# Patient Record
Sex: Male | Born: 1955 | Race: White | Hispanic: No | Marital: Married | Smoking: Never smoker
Health system: Southern US, Academic
[De-identification: ages and names within clinical notes are randomized; demographics above are authoritative.]

## PROBLEM LIST (undated history)

## (undated) DIAGNOSIS — E039 Hypothyroidism, unspecified: Secondary | ICD-10-CM

## (undated) DIAGNOSIS — E781 Pure hyperglyceridemia: Secondary | ICD-10-CM

## (undated) DIAGNOSIS — I10 Essential (primary) hypertension: Secondary | ICD-10-CM

## (undated) DIAGNOSIS — M25569 Pain in unspecified knee: Secondary | ICD-10-CM

## (undated) HISTORY — DX: Essential (primary) hypertension: I10

## (undated) HISTORY — DX: Pure hyperglyceridemia: E78.1

## (undated) HISTORY — PX: HX APPENDECTOMY: SHX54

## (undated) HISTORY — DX: Hypothyroidism, unspecified: E03.9

## (undated) HISTORY — DX: Pain in unspecified knee: M25.569

## (undated) NOTE — Progress Notes (Signed)
Formatting of this note is different from the original.  Images from the original note were not included.      Patient Name: Steven [name], Steven [name]  [id number] Date: 05/30/2023    Preferred Name: Steven [name] Date of Encounter: 05/30/2023   Patient MRN / CSN: [id number] / [id number] Appt. Time:  2:30 PM EST   Patient Age / DOB: 62 year old / [birth year] Encounter Provider: Lynann Beaver, MD [facility]   Referring Physician: No ref. provider found               Pulmonary note Follow Up    REASON FOR the visit:   pulmonary function tests follow-up    SAY:TKZS is a 63 year old who is being seen today for  further evaluation of shortness of breath.  His pulmonary function tests were normal.  He is still being worked up by his local cardiologist reports are not available but so far everything has been negative as far as the patient is concern.  We will be willing to do methacholine challenge test since the patient is environment.    Active Medical Problems:  1.hypothyroidism  2. Metabolic syndrome  3.  hyperlipidemia  4. Hypertension    Last CT Scan:  None  Smoking history: never smoker  Occupational history:  Theatre stage manager  .  Allergies:  Steven [name] has No Known Allergies.    Medications:  Current Outpatient Medications   Medication Sig Dispense Refill    atorvastatin (Lipitor) 10 MG tablet Take by mouth Daily.      levothyroxine (Synthroid) 50 MCG tablet TAKE 1 TABLET BY MOUTH EVERY DAY FOR 90 DAYS      losartan (Cozaar) 50 MG tablet       metFORMIN (Glucophage) 500 MG tablet TAKE 1 TABLET BY MOUTH EVERY 3 DAYS      vitamin B-12 (Cyanocobalamin) 1000 MCG tablet Take 1,000 mcg by mouth Daily.      vitamin D, ergocalciferol, 1.25 MG (50000 UT) capsule        No current facility-administered medications for this visit.     Medical History:    Active Ambulatory Problems     Diagnosis Date Noted    Dyspnea 05/30/2023     Resolved Ambulatory Problems     Diagnosis Date Noted    No Resolved Ambulatory Problems     Past Medical History:    Diagnosis Date    Chronic kidney disease     Hyperlipidemia     Hypertensive disorder     Hypothyroidism      Family History   Problem Relation Name Age of Onset    Hypertension Mother      Diabetes Mother      Diabetes Father      Cancer Brother          Colon     Social History     Tobacco Use   Smoking Status Never   Smokeless Tobacco Current    Types: Chew     Social History     Substance and Sexual Activity   Drug Use Never     Immunization History   Administered Date(s) Administered    COVID 19 MRNA VACC (Moderna) 08/08/2019, 09/05/2019, 06/03/2020    COVID 19 MRNA VACC 50MCG/0.5ML (MODERNA) 05/05/2023    COVID 19 VACC BIVALENT MODERNA BOOSTER 06/23/2021     Review of Systems:   The 12 point review of systems are negative except for those mentioned in the H and P    Objective:  Room Air:     Vitals:    05/30/23 1414   BP: (!) 159/84   Pulse: 79   Resp: 20   Temp: 97.8 F (36.6 C)   SpO2: 94%     No LMP for male patient.      Physical Exam:    Alert ox3 in non significant distress  HEENT: Head is normocephalic. Pupils are equally responsive to light and accomodation.  Mouth is normal  Neck size is Normal inches  CV: Normal heart sounds  Lungs:  breath sounds are distant to auscultation and percussion.  No crackles no rhonchi no wheezes.  Abdomen: Soft non tender,no visceromegaly  Extremities: No edema  Neurological exam: Alert ox 3. No focal findings  Psychiatrix Exam; Patien in no distress    Assessment and Plan:     75 year old gentleman with evidence for shortness of breath who is currently being evaluated by his cardiologist in [facility].  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  Dyspnea on exertion   2.  Hypertension   3.  BMI 37     Recommendations   1.  Await Cardiology clearance   2.  Methacholine challenge test   3.  Return to clinic in months   4.  Weight  loss via low carb diet    Problem List Items Addressed This Visit         Dyspnea - Primary     Relevant Orders     Bronchial Challenge with Methacholine     Other Visit Diagnosis       BMI 37.0-37.9, adult               No follow-ups on file.      A medical dictation software was used for portions of this report. Unintended voice transcription errors may have occurred.The purpose of this note is to communicate optimally with the other physicians, advanced care practitioners and appropriate medical staff involved in your care. It is written using standard medical terminology.        Elna Breslow. Felton Clinton MD,FCCP,FACP,FAASM  Pulmonary Critical Care Sleep Medicine Consultant  [phone]                                                                                      Electronically signed by Lynann Beaver, MD at 05/30/2023  2:57 PM EST

---

## 1990-12-30 ENCOUNTER — Emergency Department (HOSPITAL_COMMUNITY): Payer: Self-pay

## 2021-04-21 IMAGING — MR MRI KNEE LT W/O CONTRAST
4 of 5 series · 20 of 40 positions shown · IV contrast (gadolinium)
Comparison: None available.

﻿EXAM:  14118   MRI KNEE LT W/O CONTRAST
INDICATION: Pain for 1 month.
TECHNIQUE: Multiplanar multisequential MRI of the left knee joint was performed without gadolinium contrast.

[Series 5: PD fat-sat · axial · left · 4.0mm · 0.37mm/px · z∈[-95,+36]mm · 8 of 30 slices shown (1 of 3)]
[im 1/30]
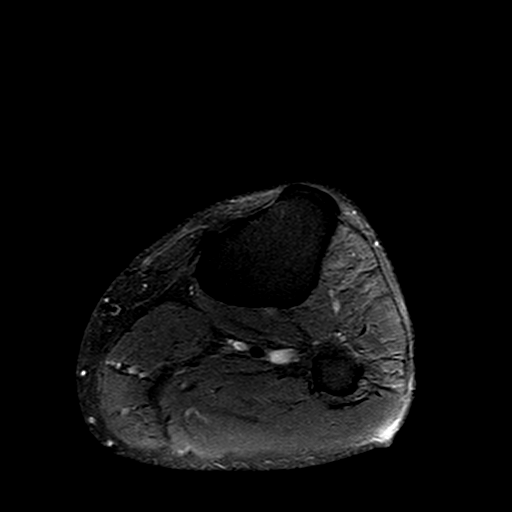
[im 5/30]
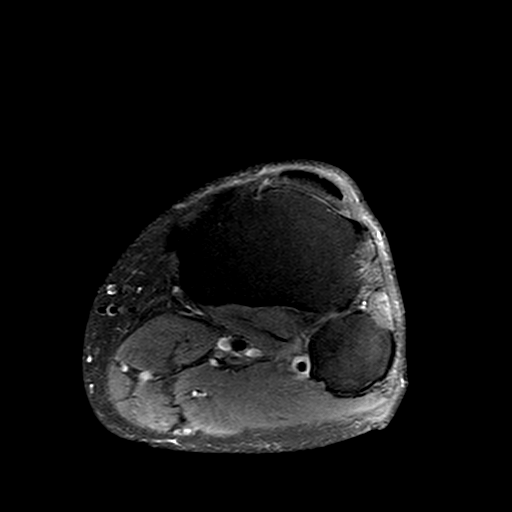
[im 9/30]
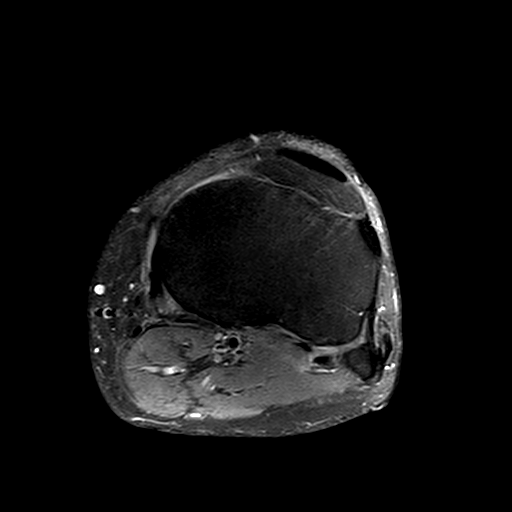
[im 13/30]
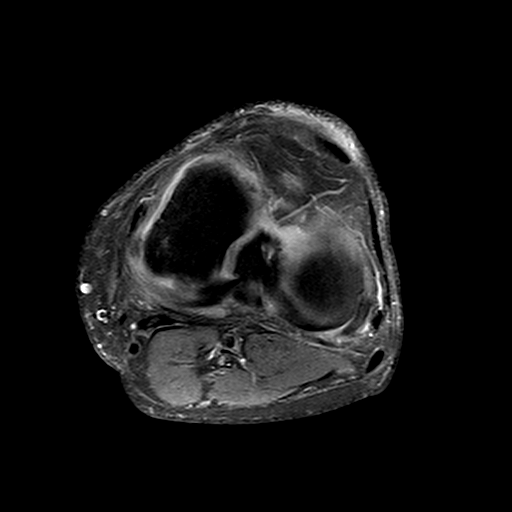
[im 17/30]
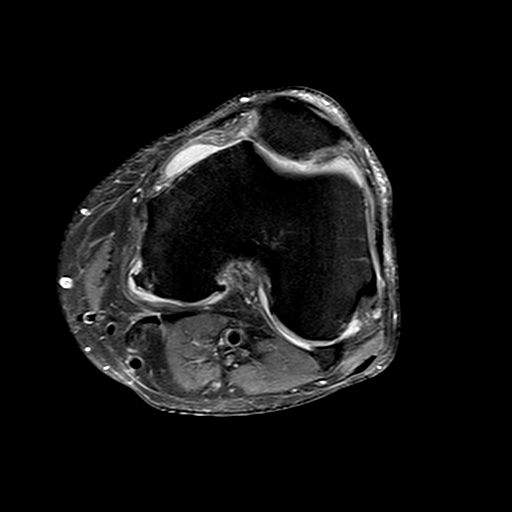
[im 21/30]
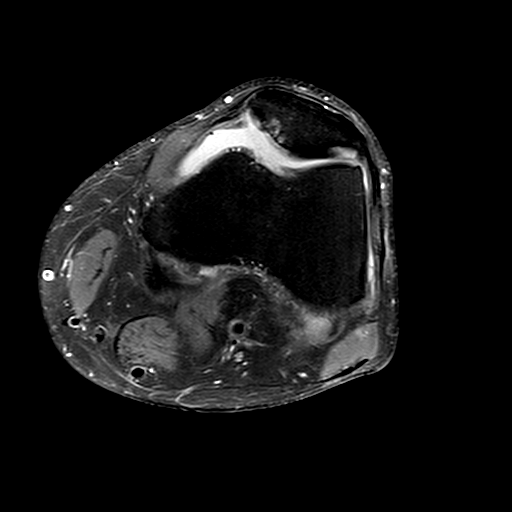
[im 25/30]
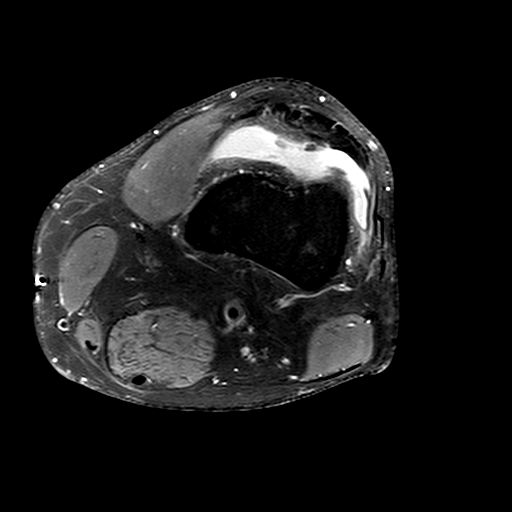
[im 30/30]
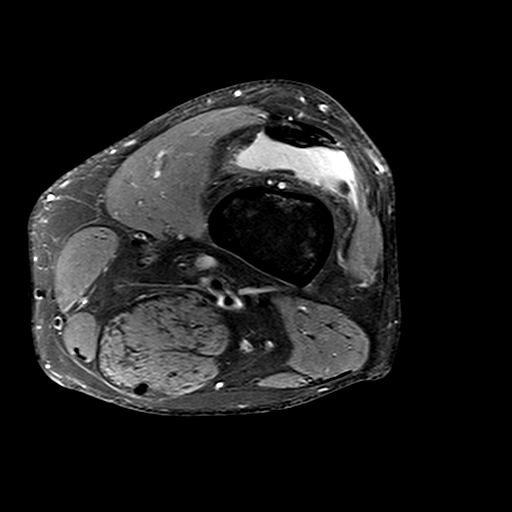

[Series 6: PD fat-sat · sagittal · left · 3.0mm · 0.29mm/px · 6 of 30 slices shown (2 of 3)]
[im 1/30]
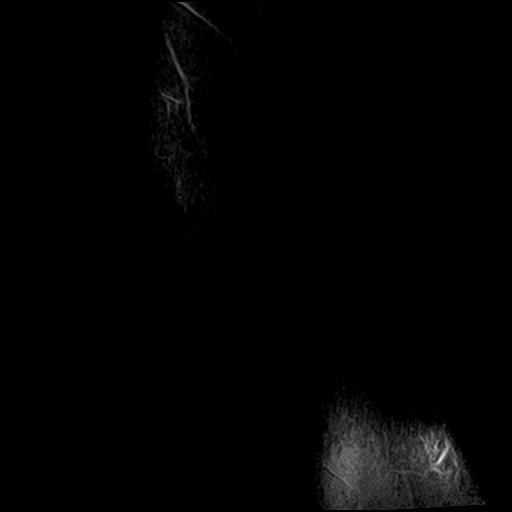
[im 5/30]
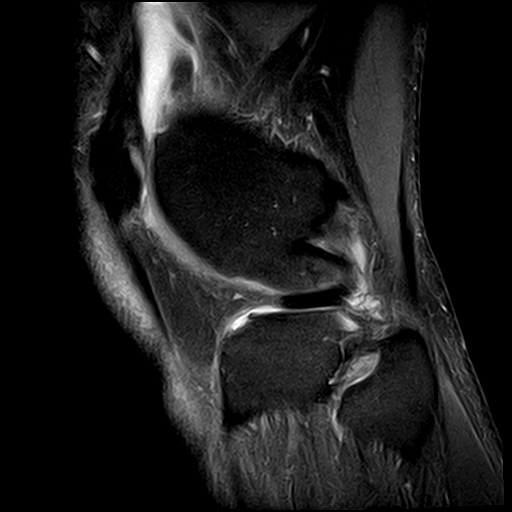
[im 9/30]
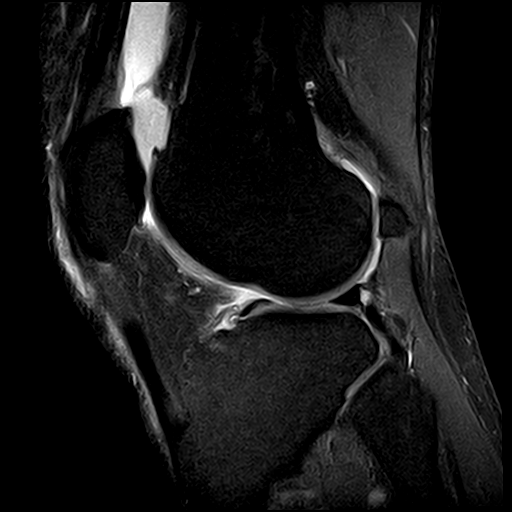
[im 13/30]
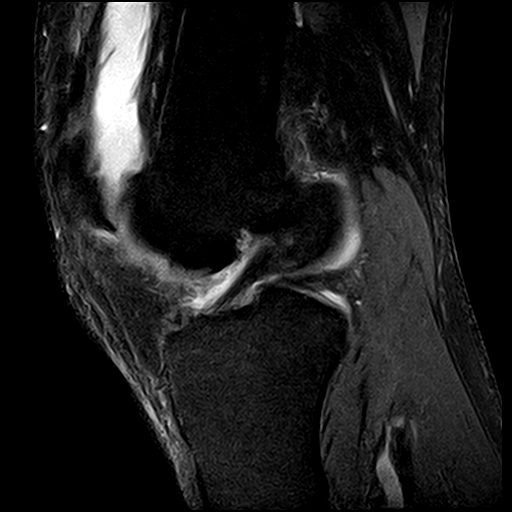
[im 17/30]
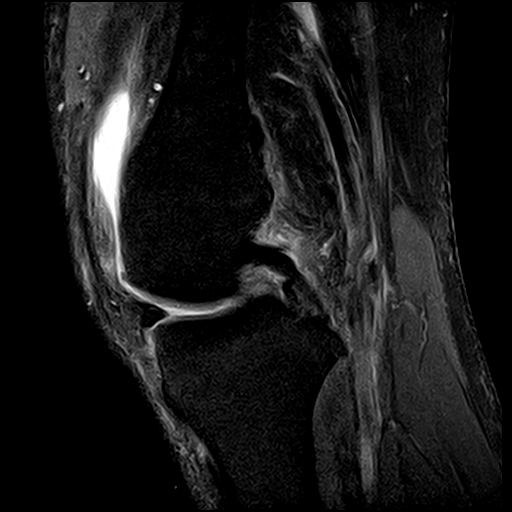
[im 25/30]
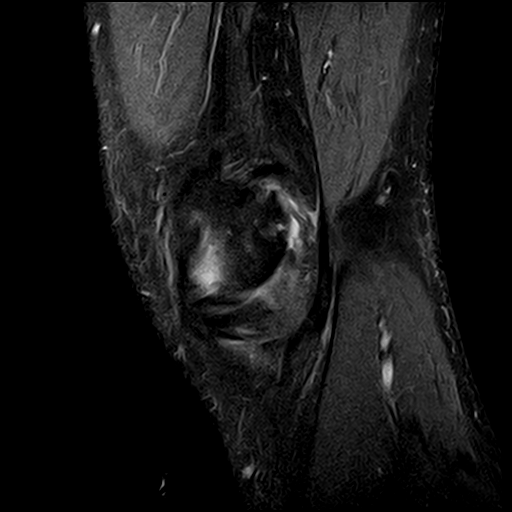

[Series 7: T1 · sagittal · left · 3.0mm · 0.29mm/px · 3 of 30 slices shown]
[im 5/30]
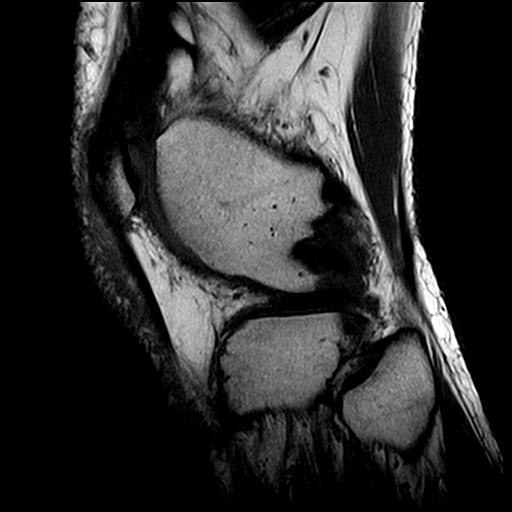
[im 17/30]
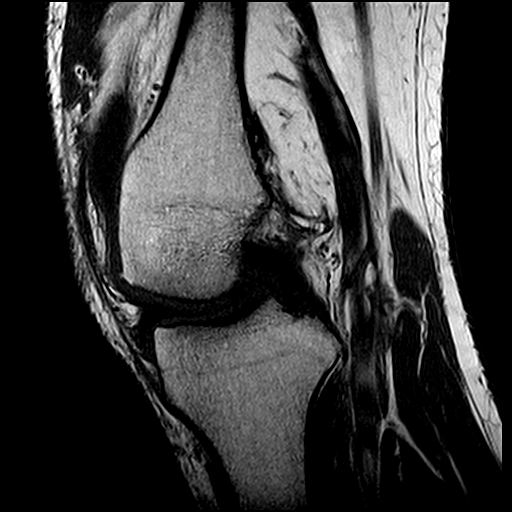
[im 25/30]
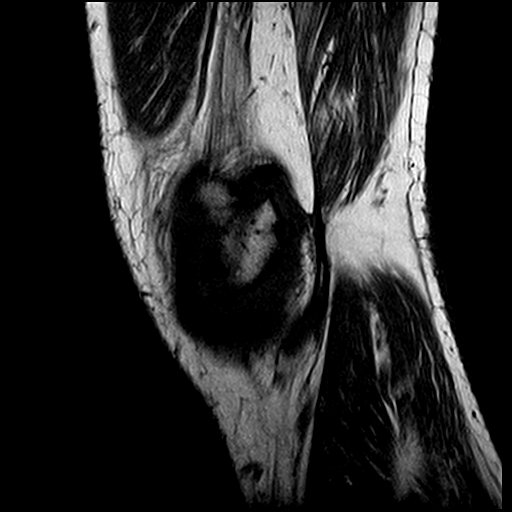

[Series 9: PD fat-sat · coronal · left · 3.0mm · 0.33mm/px · 3 of 27 slices shown (3 of 3)]
[im 4/27]
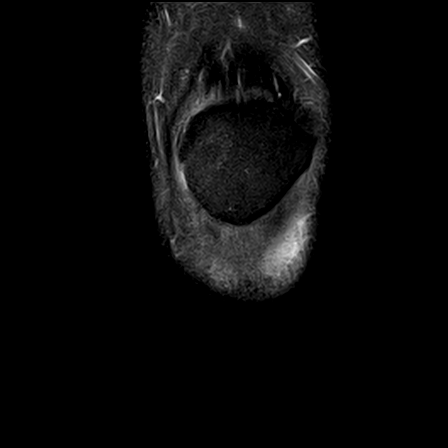
[im 15/27]
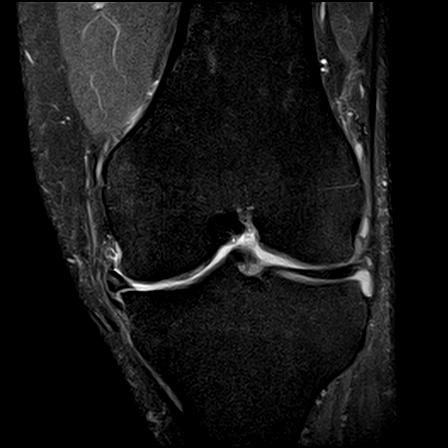
[im 23/27]
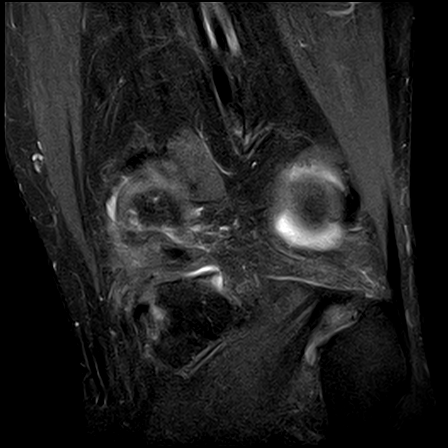

[20 of 40 positions shown; findings below may reference images not displayed]

FINDINGS: There is a complex medial meniscus tear. Lateral meniscus, cruciate and collateral ligaments are intact, within normal limits in morphology and signal intensity. There is grade 4 chondromalacia of the medial tibiofemoral and patellofemoral articulations. Extensor mechanism is intact. Capsular attachments appear unremarkable. Bone marrow signal intensity is normal. There is a moderate suprapatellar effusion. There is no Baker's cyst.
IMPRESSION: 1. Complex medial meniscus tear. 

2. Grade 4 chondromalacia of the medial tibiofemoral and patellofemoral articulations.

## 2022-02-28 IMAGING — MR MRI BRAIN W/O CONTRAST
6 of 8 series · 32 of 48 positions shown · IV contrast (gadavist)
Comparison: No prior imaging studies available.

﻿EXAM:  MRI BRAIN W/O CONTRAST
INDICATION: 65-year-old male with mild cognitive impairment.  Hypertension. Diabetic.
TECHNIQUE: Multiplanar, multisequential MRI of the brain was performed without Gadavist.

[Series 6: DWI · axial · 5.0mm · 1.35mm/px · z∈[-64,+80]mm · 6 of 25 slices shown (1 of 2)]
[im 1/25]
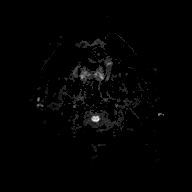
[im 5/25]
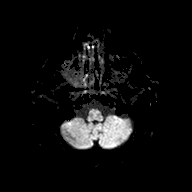
[im 10/25]
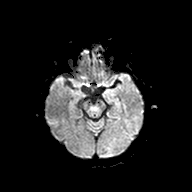
[im 15/25]
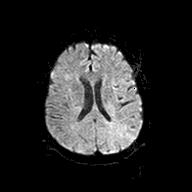
[im 20/25]
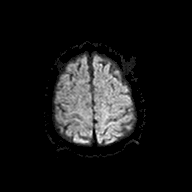
[im 25/25]
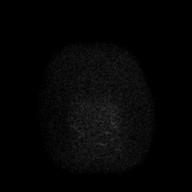

[Series 7: DWI · axial · 5.0mm · 1.35mm/px · z∈[-64,+80]mm · 6 of 23 slices shown (2 of 2)]
[im 1/23]
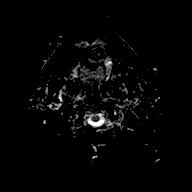
[im 5/23]
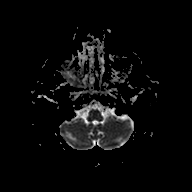
[im 9/23]
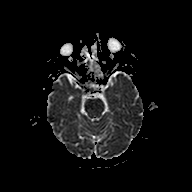
[im 14/23]
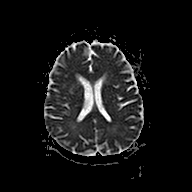
[im 18/23]
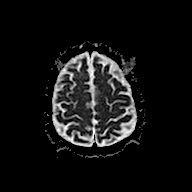
[im 23/23]
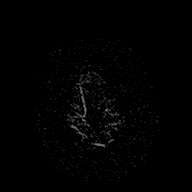

[Series 8: FLAIR · sagittal · 4.0mm · 0.75mm/px · 6 of 26 slices shown (1 of 2)]
[im 1/26]
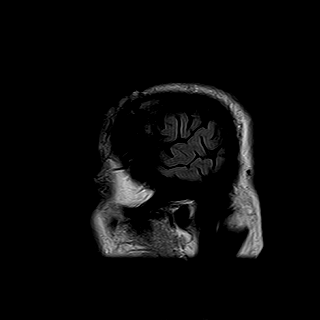
[im 6/26]
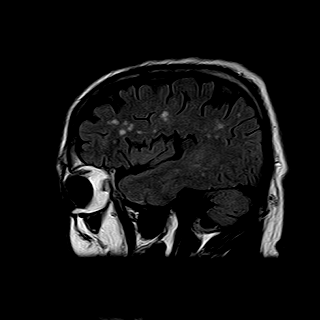
[im 11/26]
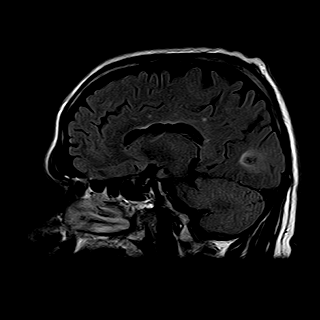
[im 16/26]
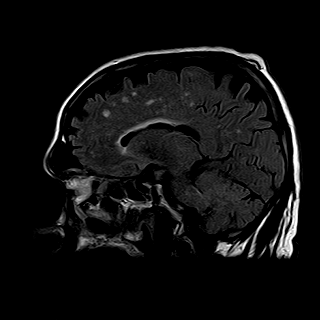
[im 21/26]
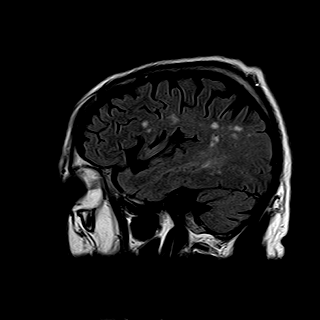
[im 26/26]
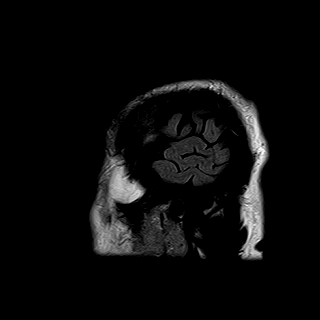

[Series 9: T2 · axial · 5.0mm · 0.43mm/px · z∈[-59,+85]mm · 6 of 25 slices shown (1 of 2)]
[im 1/25]
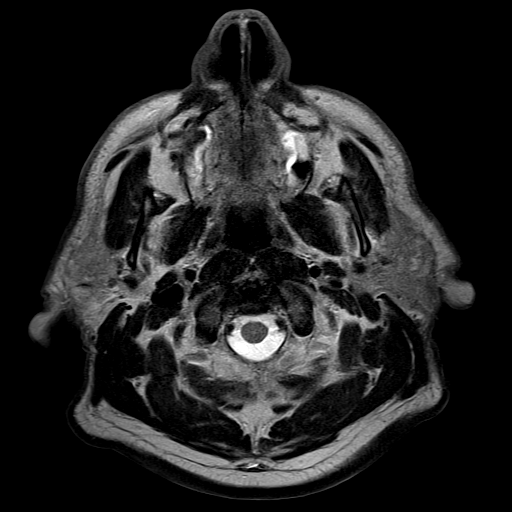
[im 5/25]
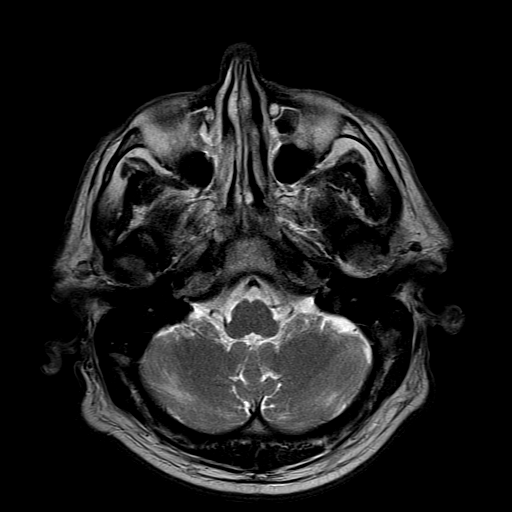
[im 10/25]
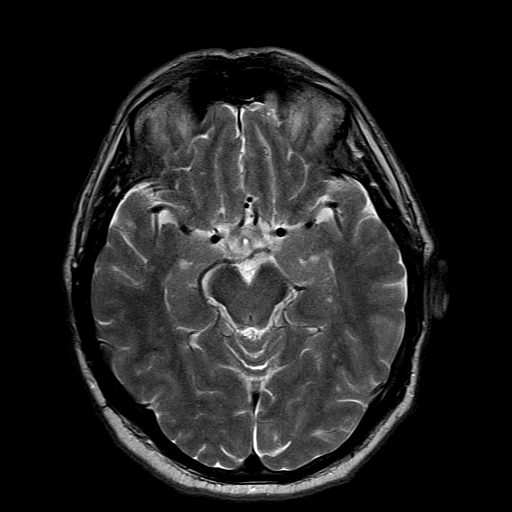
[im 15/25]
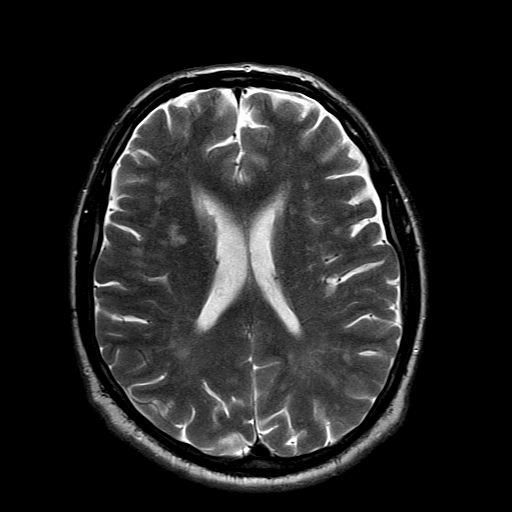
[im 20/25]
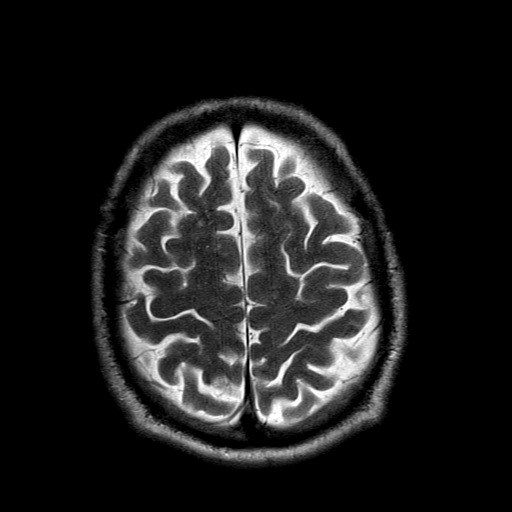
[im 25/25]
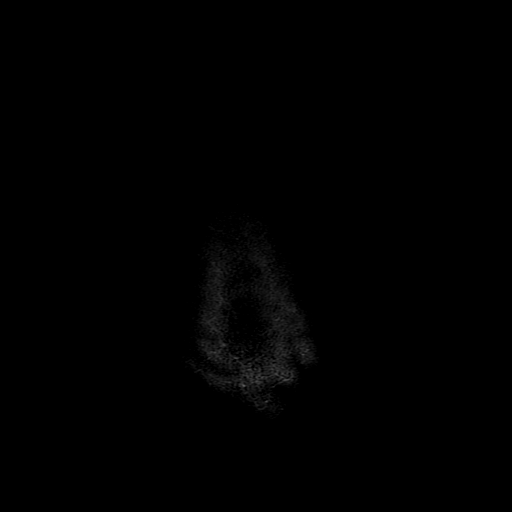

[Series 10: FLAIR · axial · 5.0mm · 0.43mm/px · z∈[-59,+85]mm · 6 of 25 slices shown (2 of 2)]
[im 1/25]
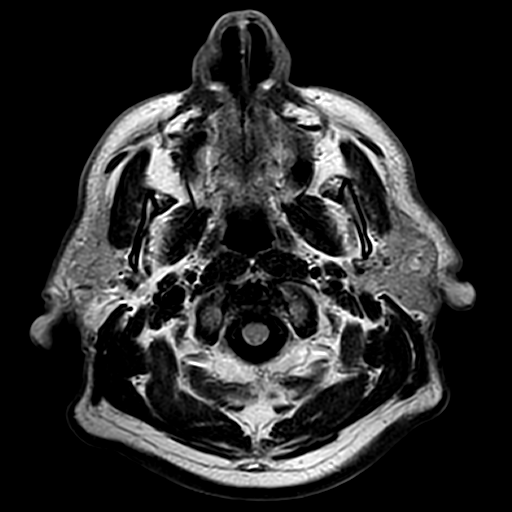
[im 5/25]
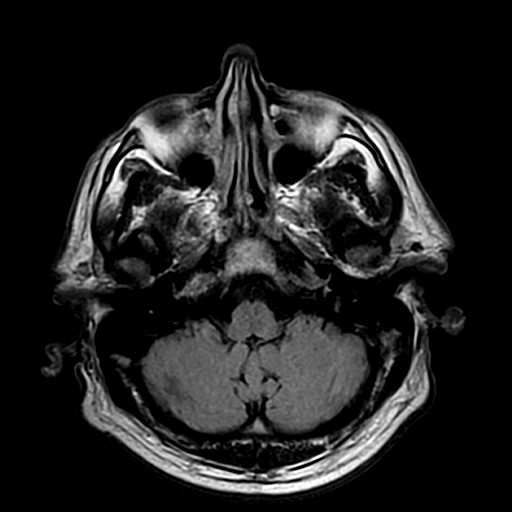
[im 10/25]
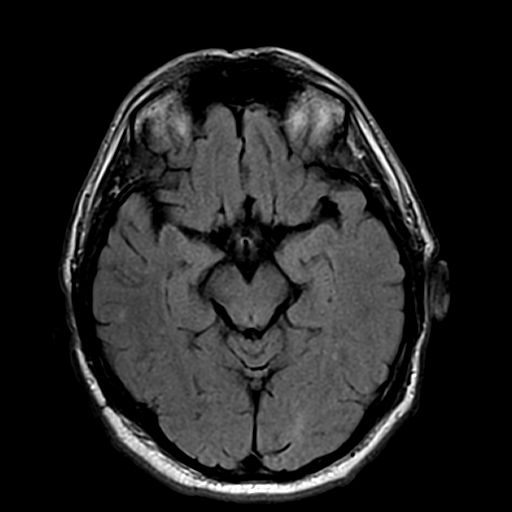
[im 15/25]
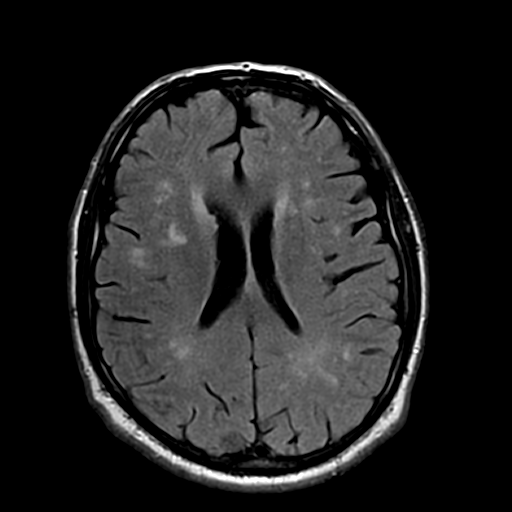
[im 20/25]
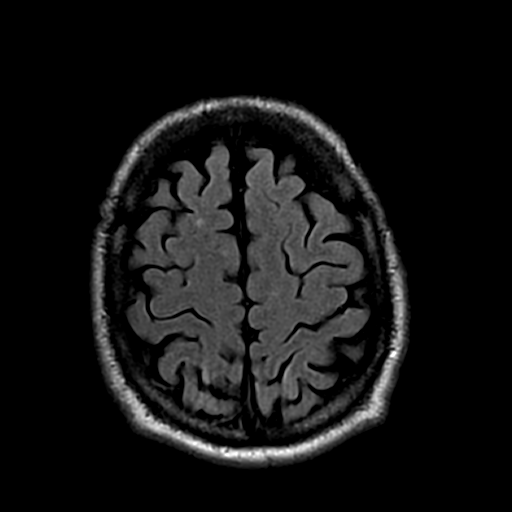
[im 25/25]
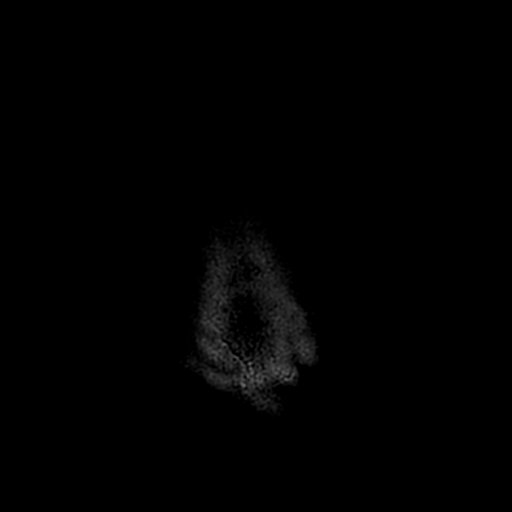

[Series 13: T2 · coronal · 6.0mm · 0.43mm/px · 2 of 24 slices shown (2 of 2)]
[im 1/24]
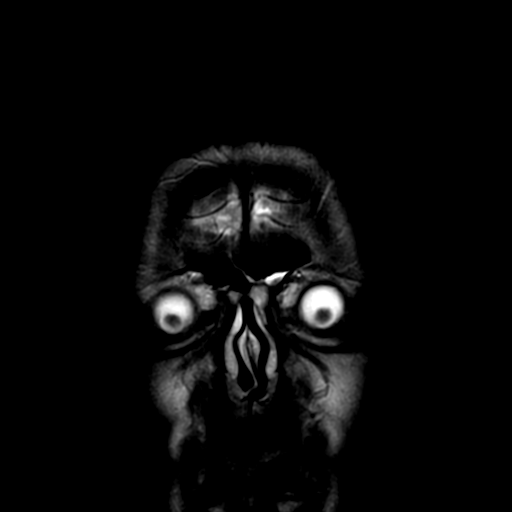
[im 5/24]
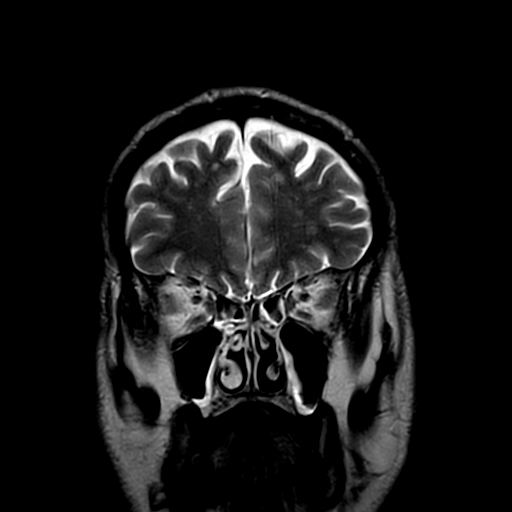

[32 of 48 positions shown; findings below may reference images not displayed]

FINDINGS: No acute or subacute ischemic changes are noted in the diffusion sequence.  No intracranial bleed or extra-axial collections are seen.  No evidence of ventriculomegaly or midline shift.  Major arteries of circle of Willis and dural venous sinuses are patent. 

Extensive, bilaterally symmetric subcortical and periventricular FLAIR bright lesions are seen on both sides.  The lesions are not in the typical orientation of multiple sclerosis and more likely to represent extensive chronic small vessel ischemic change.  Mild age-appropriate symmetric cerebral cortical atrophy.  No posterior fossa abnormalities are seen.

Mastoids are clear.  Inflammatory changes of maxillary and ethmoid sinuses are noted.
IMPRESSION: 1.  No intracranial space-occupying lesions, ventriculomegaly or midline shift.

2. Extensive FLAIR signal abnormalities of subcortical and periventricular white matter on both sides, probably representing extensive chronic small-vessel ischemic change.

3. Major arteries of circle of Willis and dural venous sinuses are patent.

4. Sinusitis predominantly involving maxillary and ethmoid sinuses.

## 2022-03-16 ENCOUNTER — Encounter (INDEPENDENT_AMBULATORY_CARE_PROVIDER_SITE_OTHER): Payer: Self-pay | Admitting: Nephrology

## 2022-03-16 ENCOUNTER — Ambulatory Visit (INDEPENDENT_AMBULATORY_CARE_PROVIDER_SITE_OTHER): Payer: Medicare (Managed Care) | Admitting: Nephrology

## 2022-03-16 ENCOUNTER — Other Ambulatory Visit: Payer: Self-pay

## 2022-03-16 VITALS — BP 126/66 | HR 77 | Ht 67.0 in | Wt 240.0 lb

## 2022-03-16 DIAGNOSIS — N1831 Chronic kidney disease, stage 3a (CMS HCC): Secondary | ICD-10-CM

## 2022-03-16 DIAGNOSIS — M1710 Unilateral primary osteoarthritis, unspecified knee: Secondary | ICD-10-CM

## 2022-03-16 DIAGNOSIS — M1711 Unilateral primary osteoarthritis, right knee: Secondary | ICD-10-CM

## 2022-03-16 DIAGNOSIS — Z791 Long term (current) use of non-steroidal anti-inflammatories (NSAID): Secondary | ICD-10-CM

## 2022-03-16 DIAGNOSIS — I129 Hypertensive chronic kidney disease with stage 1 through stage 4 chronic kidney disease, or unspecified chronic kidney disease: Secondary | ICD-10-CM

## 2022-03-16 DIAGNOSIS — I10 Essential (primary) hypertension: Secondary | ICD-10-CM

## 2022-03-16 DIAGNOSIS — E1121 Type 2 diabetes mellitus with diabetic nephropathy: Secondary | ICD-10-CM

## 2022-03-16 DIAGNOSIS — E559 Vitamin D deficiency, unspecified: Secondary | ICD-10-CM

## 2022-03-16 DIAGNOSIS — E1122 Type 2 diabetes mellitus with diabetic chronic kidney disease: Secondary | ICD-10-CM

## 2022-03-16 NOTE — Progress Notes (Signed)
[facility]  NEPHROLOGY, [facility]    Progress Note    Name: ADVAIT BUICE MRN:  [id number]   Date: 03/16/2022 Age: 66 y.o.          Nephrology Out  Patient Visit       Reason for the visit/consultation:  CKD STAGE 3 A  HPI : 66 y.o. GENTLEMAN WITH PAST MEDICAL HISTORY DIABETES HYPERTENSION ARTHRITIS ON NSAIDS NOT ON INSULIN PRESENTING TO ESTABLISH CARE FOR CKD STAGE 3.    Patient denies edema denies dysuria.  Patient takes NSAIDs for right knee arthritis.        Lab work from other lab:  Baseline creatinine 1.46/GFR 52 on 01/20/2022  ROS:     Systematic review of 12 organ systems was negative except what mentioned in in the HPI.     OBJECTIVE:   BP 126/66 (Site: Upper Extremity, Patient Position: Sitting)   Pulse 77   Ht 1.702 m (5\' 7" )   Wt 109 kg (240 lb)   BMI 37.59 kg/m       General:  NAD, AAOx3  HEENT:  EOMI,  no icterus  NECK: No increased JVD.  Normal inspection   HEART: Normal S1 and S2. Regular rhythm. No murmurs or rubs. No chest wall tenderness   LUNGS: Clear to auscultation bilateral. No wheezes, rales, or rhonchi.   ABDOMEN: +BS, Soft, nontender and nondistended. No rebound or guarding present.   EXTREMITIES: No edema. No cyanosis or clubbing.No asterixis    NEURO : Normal speech, EOMI.       LABORATORY DATA:   No results found for: BUN, CREATININE, BUNCRRATIO, GFR, SODIUM, POTASSIUM, CHLORIDE, CO2, ANIONGAP, CALCIUM, PHOSPHORUS, ALBUMIN, HGB, HCT, INTACTPTH, IRON, IRONBINDCAP, IRONSAT, FERRITIN, 25HYDVITD2D3, 25HYDVITD3        MEDICATIONS:  No outpatient medications have been marked as taking for the 03/16/22 encounter (Office Visit) with 05/16/22, MD.     Current Outpatient Medications   Medication Instructions    atorvastatin (LIPITOR) 10 mg Oral Tablet TAKE 1 TABLET BY MOUTH EVERY DAY FOR 90 DAYS    cyanocobalamin (VITAMIN B 12) 1,000 mcg, Oral, DAILY    ergocalciferol (vitamin D2) (DRISDOL) 50,000 Units, Oral, EVERY 7 DAYS    fluticasone propionate (FLONASE) 50  mcg/actuation Nasal Spray, Suspension 1 Spray, Nasal    levothyroxine (SYNTHROID) 50 mcg Oral Tablet TAKE 1 TABLET BY MOUTH EVERY DAY FOR 90 DAYS    losartan (COZAAR) 50 mg Oral Tablet TAKE 1 TABLET BY MOUTH EVERY DAY FOR 90 DAYS    metFORMIN (GLUCOPHAGE) 500 mg Oral Tablet TAKE 1 TABLET BY MOUTH EVERY DAY FOR 90 DAYS       ASSESSMENT / PLAN:   ENCOUNTER DIAGNOSES     ICD-10-CM   1. Stage 3a chronic kidney disease (CMS HCC)  N18.31   2. Vitamin D deficiency  E55.9   3. Essential (primary) hypertension  I10   4. Type 2 diabetes mellitus with diabetic nephropathy, without long-term current use of insulin (CMS HCC)  E11.21   5. Arthritis of knee  M17.10            Chronic kidney disease  -Stage IIIa  -Due to diabetes and hypertension, NSAIDs.  -Creatinine is stable      -Baseline creatinine 1.46  -We will check UA  -Total protein to creatinine ratio  -UACR  -CBC and a basic metabolic panel  -Return to clinic in 4 months  -Continue low-sodium diet  -balanced Fluid intake 40-50 oz a  day.    -Avoid NSAIDs.  Tylenol only for pain  -Renal imaging  -ACEI or ARBS:  yes  -Sodium Glucose Cotransporter-2 (SGLT2) Inhibitors:  N     CKD bone mineral disease  -PTH No results found for: IPTH   -Vitamin D level No results found for: VITD25] and vitamin-D supplements  -Phosphorus level. No results found for: PHOSPHORUS      Hypertension  -Blood pressure is acceptable  -Goal less than 130/80  -Continue lisinopril  -Low-salt diet    Diabetes type 2  -A1c goal is less than 7 No results found for: HA1C   -Continue current medications  -Diabetic diet  -Increase activity and exercise as possible  -Monitor A1C  -may benefit from [place]    Right knee arthritis  -DC meloxicam.  Tylenol only   -refer to orthopedic surgery for joint injections.        Orders Placed This Encounter    BASIC METABOLIC PANEL    CBC/DIFF    MAGNESIUM    CANCELED: MICROALBUMIN URINE, RANDOM    PARATHYROID HORMONE (PTH)    PROTEIN/CREATININE RATIO, URINE, RANDOM     VITAMIN D 25 TOTAL    MICROALBUMIN/CREATININE RATIO, URINE, RANDOM    HGA1C (HEMOGLOBIN A1C WITH EST AVG GLUCOSE)        Rhina Brackett, MD

## 2022-03-16 NOTE — Nursing Note (Signed)
[facility]  NEPHROLOGY, [facility]    Progress Note    Name: Steven [name] MRN:  [id number]   Date: 03/16/2022 Age: 66 y.o.          Nephrology Out  Patient Visit       Reason for the visit/consultation: CKD-3a  HPI : 66 y.o. male with PMH of ckd3a, HTN, DM-2 no insulin, NSAID'S on arthritis, p/w to establish care for CKD 3A.  Patient reports history of diabetes not on insulin he takes losartan for blood pressure for more than 10 years, patient is taking meloxicam daily for right knee pain/arthritis.  Patient drinks a lot of soda and drinks very little water.  Patient denies nausea or vomiting.  No diarrhea.  Patient denies gout.    Lab work from other lab:  01/20/2022 creatinine 1.46 GFR 52.  ROS:     Systematic review of 12 organ systems was negative except what mentioned in in the HPI.     OBJECTIVE:   BP 126/66 (Site: Upper Extremity, Patient Position: Sitting)   Pulse 77   Ht 1.702 m (5\' 7" )   Wt 109 kg (240 lb)   BMI 37.59 kg/m       General:  NAD, AAOx3  HEENT:  EOMI,  no icterus  NECK: No increased JVD.  Normal inspection   HEART: Normal S1 and S2. Regular rhythm. No murmurs or rubs. No chest wall tenderness   LUNGS: Clear to auscultation bilateral. No wheezes, rales, or rhonchi.   ABDOMEN: +BS, Soft, nontender and nondistended. No rebound or guarding present.   EXTREMITIES: No edema. No cyanosis or clubbing.No asterixis    NEURO : Normal speech, EOMI.       LABORATORY DATA:   No results found for: BUN, CREATININE, BUNCRRATIO, GFR, SODIUM, POTASSIUM, CHLORIDE, CO2, ANIONGAP, CALCIUM, PHOSPHORUS, ALBUMIN, HGB, HCT, INTACTPTH, IRON, IRONBINDCAP, IRONSAT, FERRITIN, 25HYDVITD2D3, 25HYDVITD3        MEDICATIONS:  No outpatient medications have been marked as taking for the 03/16/22 encounter (Office Visit) with 05/16/22, MD.     Current Outpatient Medications   Medication Instructions    atorvastatin (LIPITOR) 10 mg Oral Tablet TAKE 1 TABLET BY MOUTH EVERY DAY FOR 90 DAYS    cyanocobalamin  (VITAMIN B 12) 1,000 mcg, Oral, DAILY    ergocalciferol (vitamin D2) (DRISDOL) 50,000 Units, Oral, EVERY 7 DAYS    fluticasone propionate (FLONASE) 50 mcg/actuation Nasal Spray, Suspension 1 Spray, Nasal    levothyroxine (SYNTHROID) 50 mcg Oral Tablet TAKE 1 TABLET BY MOUTH EVERY DAY FOR 90 DAYS    losartan (COZAAR) 50 mg Oral Tablet TAKE 1 TABLET BY MOUTH EVERY DAY FOR 90 DAYS    metFORMIN (GLUCOPHAGE) 500 mg Oral Tablet TAKE 1 TABLET BY MOUTH EVERY DAY FOR 90 DAYS       ASSESSMENT / PLAN:   ENCOUNTER DIAGNOSES     ICD-10-CM   1. Stage 3a chronic kidney disease (CMS HCC)  N18.31   2. Vitamin D deficiency  E55.9           Chronic kidney disease  -Stage IIIA  -Due to diabetes and hypertension, NSAIDS use.  -d/c Nsaids  -Creatinine is stable      -Baseline creatinine 1.9  -We will check UA  -Total protein to creatinine ratio  -UACR  -CBC and a basic metabolic panel  -Return to clinic in 4 months  -Continue low-sodium diet  -balanced Fluid intake 40-50 oz a day.    -Avoid NSAIDs.  Tylenol only for pain.  STOP MELOXICAM COMPLETELY  -Renal imaging  -ACEI or ARBS:  yes  -Sodium Glucose Cotransporter-2 (SGLT2) Inhibitors: N     CKD bone mineral disease  -PTH No results found for: IPTH   -Vitamin D level No results found for: VITD25] on VITD Supp.  -Phosphorus level. No results found for: PHOSPHORUS      Hypertension  -Blood pressure is acceptable  -Goal less than 130/80  -Continue LOSARTAN  -Low-salt diet    Diabetes type 2  -A1c goal is less than 7 No results found for: HA1C   -Continue current medications METFORMIN.  -Diabetic diet  -Increase activity and exercise as possible  -Monitor A1C  -may add Farxiga        Right knee pain/arthritis  -refer to ortho for injections  -d/c meloxicam.  Use Tylenol only.    Orders Placed This Encounter    BASIC METABOLIC PANEL    CBC/DIFF    MAGNESIUM    MICROALBUMIN URINE, RANDOM    PARATHYROID HORMONE (PTH)    PROTEIN/CREATININE RATIO, URINE, RANDOM    VITAMIN D 25 TOTAL            Rhina Brackett, MD

## 2022-03-31 ENCOUNTER — Ambulatory Visit (INDEPENDENT_AMBULATORY_CARE_PROVIDER_SITE_OTHER): Payer: Self-pay | Admitting: NEUROLOGY

## 2022-04-05 ENCOUNTER — Encounter (INDEPENDENT_AMBULATORY_CARE_PROVIDER_SITE_OTHER): Payer: Self-pay | Admitting: NEUROLOGY

## 2022-04-05 DIAGNOSIS — E785 Hyperlipidemia, unspecified: Secondary | ICD-10-CM | POA: Insufficient documentation

## 2022-04-05 DIAGNOSIS — E119 Type 2 diabetes mellitus without complications: Secondary | ICD-10-CM

## 2022-04-05 DIAGNOSIS — E039 Hypothyroidism, unspecified: Secondary | ICD-10-CM

## 2022-04-25 ENCOUNTER — Encounter (INDEPENDENT_AMBULATORY_CARE_PROVIDER_SITE_OTHER): Payer: Self-pay | Admitting: NEUROLOGY

## 2022-04-25 ENCOUNTER — Other Ambulatory Visit: Payer: Self-pay

## 2022-04-25 ENCOUNTER — Ambulatory Visit (INDEPENDENT_AMBULATORY_CARE_PROVIDER_SITE_OTHER): Payer: Medicare (Managed Care) | Admitting: NEUROLOGY

## 2022-04-25 VITALS — BP 150/71 | HR 87 | Temp 98.6°F | Ht 71.0 in | Wt 241.8 lb

## 2022-04-25 DIAGNOSIS — R9089 Other abnormal findings on diagnostic imaging of central nervous system: Secondary | ICD-10-CM

## 2022-04-25 NOTE — Progress Notes (Unsigned)
ASSESSMENT  1.  2.    PLAN  1.  2.    Thank you for allowing me to participate in your patient's care and please do not hesitate to contact me for any questions or concerns.    Patrick North, DO  Assistant Professor of Neurology  [facility]       ==========================================================================================================================================    NAME:  Steven [name]  DOB:  March 18, 1956  VISIT DATE:  04/25/2022    CC:  Abnormal MRI    Patient seen in consultation at the request of Dr. Miguel Aschoff  History obtained from the patient and chart/records  Age of patient:  66 y.o.      HPI:   I had the pleasure of seeing your patient in neurology clinic for an outpatient consultation, who is a 66 y.o. year old male who was referred for evaluation of .  Please allow me to summarize the history for the record.    Forgetting names sometimes. Will some    ============================================================================================================================================  PMHx  Patient Active Problem List   Diagnosis    Hyperlipidemia    Hypothyroidism    Type 2 diabetes mellitus (CMS HCC)     Past Surgical History:   Procedure Laterality Date    HX APPENDECTOMY           Family Medical History:    None         Current Outpatient Medications   Medication Sig Dispense Refill    atorvastatin (LIPITOR) 10 mg Oral Tablet TAKE 1 TABLET BY MOUTH EVERY DAY FOR 90 DAYS      cyanocobalamin (VITAMIN B 12) 1,000 mcg Oral Tablet Take 1 Tablet (1,000 mcg total) by mouth Once a day      ergocalciferol, vitamin D2, (DRISDOL) 1,250 mcg (50,000 unit) Oral Capsule Take 1 Capsule (50,000 Units total) by mouth Every 7 days      fluticasone propionate (FLONASE) 50 mcg/actuation Nasal Spray, Suspension Administer 1 Spray into affected nostril(s)      levothyroxine (SYNTHROID) 50 mcg Oral Tablet TAKE 1 TABLET BY MOUTH EVERY DAY FOR 90 DAYS       losartan (COZAAR) 50 mg Oral Tablet TAKE 1 TABLET BY MOUTH EVERY DAY FOR 90 DAYS      metFORMIN (GLUCOPHAGE) 500 mg Oral Tablet TAKE 1 TABLET BY MOUTH EVERY DAY FOR 90 DAYS       No current facility-administered medications for this visit.     No Known Allergies  Social History     Socioeconomic History    Marital status: Married     Spouse name: Not on file    Number of children: Not on file    Years of education: Not on file    Highest education level: Not on file   Occupational History    Not on file   Tobacco Use    Smoking status: Never    Smokeless tobacco: Never   Substance and Sexual Activity    Alcohol use: Never    Drug use: Never    Sexual activity: Not on file   Other Topics Concern    Not on file   Social History Narrative    Not on file     Social Determinants of Health     Financial Resource Strain: Not on file   Transportation Needs: Not on file   Social Connections: Not on file   Intimate Partner Violence: Not on file   Housing Stability: Not  on file       ============================================================================================================================================  GENERAL EXAMINATION  There were no vitals taken for this visit.      Vital signs personally reviewed  General: No acute distress, alert  HEENT: Normocephalic, no scleral icterus  Pulmonary: No accessory muscle use, no tachypnea  Cardiovascular: Heart with regular rate & rhythm  Extremities: No significant edema, No cyanosis    NEUROLOGIC EXAM  On neurological exam, patient was awake, alert and answering questions appropriately  Speech was fluent, without dysarthria or aphasia.    CN  II: not directly tested, grossly intact  III, IV, VI: extraocular movements intact without nystagmus  V: intact to light touch  VII: face symmetric without weakness  VIII: grossly intact  IX, X: symmetric palatal elevation  XI: normal strength of trapezius and sternocleidomastoid bilaterally  XII: tongue midline with full  movements    MOTOR  Bulk: normal  Tone: normal  Abnormal Movements: none  Fasciculations: none    Strength: Formal strength testing was deferred. Patient moving all extremities symmetrically and against gravity***    MRC Grading Scale   Right Left   Deltoid *** ***   Biceps *** ***   Triceps *** ***   Wrist Extension *** ***   Wrist Flexion - -   Finger Extension *** ***   Finger Abduction *** ***   Finger Flexion *** ***   Hip Flexion *** ***   Hip Extension - -   Hip Abduction - -   Hip Adduction - -   Knee Extension *** ***   Knee Flexion *** ***   Ankle Dorsiflexion *** ***   Ankle Plantarflexion *** ***   Toe Extension *** ***   Toe Flexion - -     REFLEXES   Right Left   Biceps *** ***   Triceps *** ***   Brachioradialis *** ***   Patellar *** ***   Achilles *** ***   Plantar *** ***   Hoffman *** ***   Pectoralis - -   Jaw Jerk - -       SENSORY  Light touch: ***  Pin: ***  Vibration: ***  Proprioception: ***    GAIT  General: casual, normal gait  Toe Walk: normal  Heel Walk: normal  Tandem: normal    COORDINATION  Finger nose finger: normal  Heel to shin: normal    ================================================================================================================================LABS  Personal Review of prior labs is notable for:   2023  TSH normal  A1c 5.9   Lipid panel unremarkable   BMP with mildly elevated creatinine  Vitamin-D within normal limits  CBC within normal limits  IMAGING  Personal Review of imaging is notable for:   MRI of the brain without contrast February 28, 2022 -imaging not available for review.  The report indicates extensive chronic white matter disease consistent with small-vessel ischemic changes.  OTHER DIAGNOSTICS  Personal Review of other prior diagnostics is notable for:  Not applicable

## 2022-07-12 ENCOUNTER — Other Ambulatory Visit: Payer: Medicare (Managed Care) | Attending: Nephrology | Admitting: Nephrology

## 2022-07-12 ENCOUNTER — Other Ambulatory Visit: Payer: Self-pay

## 2022-07-12 ENCOUNTER — Ambulatory Visit (INDEPENDENT_AMBULATORY_CARE_PROVIDER_SITE_OTHER): Payer: MEDICAID

## 2022-07-12 DIAGNOSIS — M1710 Unilateral primary osteoarthritis, unspecified knee: Secondary | ICD-10-CM | POA: Insufficient documentation

## 2022-07-12 DIAGNOSIS — I129 Hypertensive chronic kidney disease with stage 1 through stage 4 chronic kidney disease, or unspecified chronic kidney disease: Secondary | ICD-10-CM | POA: Insufficient documentation

## 2022-07-12 DIAGNOSIS — I10 Essential (primary) hypertension: Secondary | ICD-10-CM

## 2022-07-12 DIAGNOSIS — E559 Vitamin D deficiency, unspecified: Secondary | ICD-10-CM

## 2022-07-12 DIAGNOSIS — N1831 Chronic kidney disease, stage 3a (CMS HCC): Secondary | ICD-10-CM

## 2022-07-12 DIAGNOSIS — E1122 Type 2 diabetes mellitus with diabetic chronic kidney disease: Secondary | ICD-10-CM | POA: Insufficient documentation

## 2022-07-12 DIAGNOSIS — E1121 Type 2 diabetes mellitus with diabetic nephropathy: Secondary | ICD-10-CM

## 2022-07-12 LAB — BASIC METABOLIC PANEL
ANION GAP: 9 mmol/L (ref 4–13)
BUN/CREA RATIO: 13 (ref 6–22)
BUN: 20 mg/dL (ref 7–25)
CALCIUM: 9.9 mg/dL (ref 8.6–10.3)
CHLORIDE: 104 mmol/L (ref 98–107)
CO2 TOTAL: 26 mmol/L (ref 21–31)
CREATININE: 1.55 mg/dL — ABNORMAL HIGH (ref 0.60–1.30)
ESTIMATED GFR: 48 mL/min/{1.73_m2} — ABNORMAL LOW (ref >59)
GLUCOSE: 104 mg/dL (ref 74–109)
OSMOLALITY, CALCULATED: 281 mOsm/kg (ref 270–290)
POTASSIUM: 4.5 mmol/L (ref 3.5–5.1)
SODIUM: 139 mmol/L (ref 136–145)

## 2022-07-12 LAB — CBC WITH DIFF
BASOPHIL #: 0 10*3/uL (ref 0.00–0.10)
BASOPHIL %: 1 % (ref 0–1)
EOSINOPHIL #: 0.1 10*3/uL (ref 0.00–0.50)
EOSINOPHIL %: 2 %
HCT: 43.2 % (ref 36.7–47.1)
HGB: 14.6 g/dL (ref 12.5–16.3)
LYMPHOCYTE #: 1.7 10*3/uL (ref 1.00–3.00)
LYMPHOCYTE %: 22 % (ref 16–44)
MCH: 31.4 pg (ref 23.8–33.4)
MCHC: 33.8 g/dL (ref 32.5–36.3)
MCV: 92.9 fL (ref 73.0–96.2)
MONOCYTE #: 0.7 10*3/uL (ref 0.30–1.00)
MONOCYTE %: 10 % (ref 5–13)
MPV: 10.1 fL (ref 7.4–11.4)
NEUTROPHIL #: 5.1 10*3/uL (ref 1.85–7.80)
NEUTROPHIL %: 66 % (ref 43–77)
PLATELETS: 212 10*3/uL (ref 140–440)
RBC: 4.66 10*6/uL (ref 4.06–5.63)
RDW: 13.7 % (ref 12.1–16.2)
WBC: 7.6 10*3/uL (ref 3.6–10.2)

## 2022-07-12 LAB — MICROALBUMIN/CREATININE RATIO, URINE, RANDOM
CREATININE RANDOM URINE: 203 mg/dL — ABNORMAL HIGH (ref 11–26)
MICROALBUMIN RANDOM URINE: 0.7 mg/dL
MICROALBUMIN/CREATININE RATIO RANDOM URINE: 3.4 mg/g

## 2022-07-12 LAB — PARATHYROID HORMONE (PTH): PTH: 44.7 pg/mL (ref 12.0–88.0)

## 2022-07-12 LAB — HGA1C (HEMOGLOBIN A1C WITH EST AVG GLUCOSE): HEMOGLOBIN A1C: 6 % (ref 4.0–6.0)

## 2022-07-12 LAB — VITAMIN D 25 TOTAL: VITAMIN D: 74 ng/mL (ref 30–100)

## 2022-07-12 LAB — MAGNESIUM: MAGNESIUM: 2 mg/dL (ref 1.9–2.7)

## 2022-07-12 LAB — PROTEIN/CREATININE RATIO, URINE, RANDOM
CREATININE RANDOM URINE: 203 mg/dL — ABNORMAL HIGH (ref 11–26)
PROTEIN RANDOM URINE: 8 mg/dL — ABNORMAL LOW (ref 50–80)
PROTEIN/CREATININE RATIO RANDOM URINE: 0.039 mg/mg (ref 0.000–200.000)

## 2022-07-14 ENCOUNTER — Encounter (INDEPENDENT_AMBULATORY_CARE_PROVIDER_SITE_OTHER): Payer: Self-pay | Admitting: Nephrology

## 2022-07-20 ENCOUNTER — Other Ambulatory Visit: Payer: Self-pay

## 2022-07-20 ENCOUNTER — Ambulatory Visit (INDEPENDENT_AMBULATORY_CARE_PROVIDER_SITE_OTHER): Payer: Medicare (Managed Care) | Admitting: Nephrology

## 2022-07-20 ENCOUNTER — Encounter (INDEPENDENT_AMBULATORY_CARE_PROVIDER_SITE_OTHER): Payer: Self-pay | Admitting: Nephrology

## 2022-07-20 VITALS — BP 145/71 | HR 83 | Ht 65.0 in | Wt 236.4 lb

## 2022-07-20 DIAGNOSIS — I129 Hypertensive chronic kidney disease with stage 1 through stage 4 chronic kidney disease, or unspecified chronic kidney disease: Secondary | ICD-10-CM

## 2022-07-20 DIAGNOSIS — M1711 Unilateral primary osteoarthritis, right knee: Secondary | ICD-10-CM

## 2022-07-20 DIAGNOSIS — E1122 Type 2 diabetes mellitus with diabetic chronic kidney disease: Secondary | ICD-10-CM

## 2022-07-20 DIAGNOSIS — E559 Vitamin D deficiency, unspecified: Secondary | ICD-10-CM

## 2022-07-20 DIAGNOSIS — N1831 Chronic kidney disease, stage 3a (CMS HCC): Secondary | ICD-10-CM

## 2022-07-20 NOTE — Progress Notes (Signed)
[place], [facility]    Progress Note    Name: Steven [name] MRN:  [id number]   Date: 07/20/2022 Age: 67 y.o.          Nephrology Out  Patient Visit       Reason for the visit/consultation:  CKD STAGE 3 A  HPI : 67 y.o. gentleman with past medical history of diabetes not on insulin, hypertension, arthritis was on NSAIDs, here for follow-up on chronic kidney disease stage 3  Patient denies edema denies dysuria.  Patient takes NSAIDs for right knee arthritis.        ROS:     Systematic review of 12 organ systems was negative except what mentioned in in the HPI.     OBJECTIVE:   BP (!) 145/71   Pulse 83   Ht 1.651 m (5\' 5" )   Wt 107 kg (236 lb 6.4 oz)   BMI 39.34 kg/m       General:  NAD, AAOx3  HEENT:  EOMI,  no icterus  NECK: No increased JVD.  Normal inspection   HEART: Normal S1 and S2. Regular rhythm. No murmurs or rubs. No chest wall tenderness   LUNGS: Clear to auscultation bilateral. No wheezes, rales, or rhonchi.   ABDOMEN: +BS, Soft, nontender and nondistended. No rebound or guarding present.   EXTREMITIES: No edema. No cyanosis or clubbing.No asterixis    NEURO : Normal speech, EOMI.       LABORATORY DATA:   Lab Results   Component Value Date    BUN 20 07/12/2022    CREATININE 1.55 (H) 07/12/2022    BUNCRRATIO 13 07/12/2022    GFR 48 (L) 07/12/2022    SODIUM 139 07/12/2022    POTASSIUM 4.5 07/12/2022    CHLORIDE 104 07/12/2022    CO2 26 07/12/2022    ANIONGAP 9 07/12/2022    CALCIUM 9.9 07/12/2022    HGB 14.6 07/12/2022    HCT 43.2 07/12/2022    INTACTPTH 44.7 07/12/2022         Lab work from other lab:  Baseline creatinine 1.46/GFR 52 on 01/20/2022    MEDICATIONS:  Outpatient Medications Marked as Taking for the 07/20/22 encounter (Office Visit) with Beather Arbour, MD   Medication Sig    atorvastatin (LIPITOR) 10 mg Oral Tablet TAKE 1 TABLET BY MOUTH EVERY DAY FOR 90 DAYS    cyanocobalamin (VITAMIN B 12) 1,000 mcg Oral Tablet Take 1 Tablet (1,000 mcg total) by mouth Once a  day    ergocalciferol, vitamin D2, (DRISDOL) 1,250 mcg (50,000 unit) Oral Capsule Take 1 Capsule (50,000 Units total) by mouth Every 7 days    levothyroxine (SYNTHROID) 50 mcg Oral Tablet TAKE 1 TABLET BY MOUTH EVERY DAY FOR 90 DAYS    losartan (COZAAR) 50 mg Oral Tablet TAKE 1 TABLET BY MOUTH EVERY DAY FOR 90 DAYS    metFORMIN (GLUCOPHAGE) 500 mg Oral Tablet TAKE 1 TABLET BY MOUTH EVERY DAY FOR 90 DAYS     Current Outpatient Medications   Medication Instructions    atorvastatin (LIPITOR) 10 mg Oral Tablet TAKE 1 TABLET BY MOUTH EVERY DAY FOR 90 DAYS    cyanocobalamin (VITAMIN B 12) 1,000 mcg, Oral, DAILY    ergocalciferol (vitamin D2) (DRISDOL) 50,000 Units, Oral, EVERY 7 DAYS    levothyroxine (SYNTHROID) 50 mcg Oral Tablet TAKE 1 TABLET BY MOUTH EVERY DAY FOR 90 DAYS    losartan (COZAAR) 50 mg Oral Tablet TAKE 1 TABLET BY MOUTH  EVERY DAY FOR 90 DAYS    metFORMIN (GLUCOPHAGE) 500 mg Oral Tablet TAKE 1 TABLET BY MOUTH EVERY DAY FOR 90 DAYS       ASSESSMENT / PLAN:   ENCOUNTER DIAGNOSES     ICD-10-CM   1. Stage 3a chronic kidney disease (CMS HCC)  N18.31   2. Vitamin D deficiency  E55.9            Chronic kidney disease  -Stage IIIa  -Due to diabetes and hypertension, NSAIDs.  -Creatinine is stable     1.55  -Baseline creatinine 1.46  -We will check UA  -Total protein to creatinine ratio 0.039  -UACR 3.4  -CBC and a basic metabolic panel  -Return to clinic in 4 months  -Continue low-sodium diet  -balanced Fluid intake 40-50 oz a day.    -Avoid NSAIDs.  Tylenol only for pain  -Renal imaging  -ACEI or ARBS:  yes  -Sodium Glucose Cotransporter-2 (SGLT2) Inhibitors:  N     CKD bone mineral disease  -PTH   PTH   Date Value Ref Range Status   07/12/2022 44.7 12.0 - 88.0 pg/mL Final      -Vitamin D level No results found for: "VITD25"] and vitamin-D supplements    Hypertension  -Blood pressure is acceptable  -Goal less than 130/80  -Continue losartan  -Low-salt diet    Diabetes type 2  -A1c goal is less than 7   Lab Results    Component Value Date    HA1C 6.0 07/12/2022      -Continue current medications, continue metformin  -Diabetic diet  -Increase activity and exercise as possible  -Monitor A1C  -may benefit from [place]    Right knee arthritis  -DC meloxicam.  Tylenol only           Orders Placed This Encounter    BASIC METABOLIC PANEL    CBC/DIFF    MAGNESIUM    MICROALBUMIN/CREATININE RATIO, URINE, RANDOM    PARATHYROID HORMONE (PTH)    PROTEIN/CREATININE RATIO, URINE, RANDOM    URIC ACID    VITAMIN D 25 TOTAL        Beather Arbour, MD

## 2022-07-26 ENCOUNTER — Encounter (INDEPENDENT_AMBULATORY_CARE_PROVIDER_SITE_OTHER): Payer: Self-pay | Admitting: NEUROLOGY

## 2022-07-27 ENCOUNTER — Telehealth (INDEPENDENT_AMBULATORY_CARE_PROVIDER_SITE_OTHER): Payer: Self-pay | Admitting: NEUROLOGY

## 2022-08-02 ENCOUNTER — Ambulatory Visit (INDEPENDENT_AMBULATORY_CARE_PROVIDER_SITE_OTHER): Payer: MEDICAID | Admitting: NEUROLOGY

## 2022-08-02 ENCOUNTER — Other Ambulatory Visit: Payer: Self-pay

## 2022-08-02 ENCOUNTER — Encounter (INDEPENDENT_AMBULATORY_CARE_PROVIDER_SITE_OTHER): Payer: Self-pay | Admitting: NEUROLOGY

## 2022-08-02 VITALS — BP 137/85 | HR 96 | Temp 97.9°F | Wt 237.0 lb

## 2022-08-02 DIAGNOSIS — G3184 Mild cognitive impairment, so stated: Secondary | ICD-10-CM

## 2022-08-02 DIAGNOSIS — R9389 Abnormal findings on diagnostic imaging of other specified body structures: Secondary | ICD-10-CM

## 2022-08-02 NOTE — Progress Notes (Signed)
ASSESSMENT  ABNORMAL IMAGING:  He is a 68 year old man who is referred for abnormal MRI of the brain.  Referring notes indicate concern about cognitive decline.  The patient does not have formal complaints of cognitive decline.  He does report some mild decline in short-term memory and forgetting names. This remains unchanged from previous.  He does not report any cognitive changes that are impacting his ADLs or IADLs.   MOCA testing today revealed a score of 15/30 which would indicate a moderate-to-severe level of dysfunction. Previous MRI has shown extensive, chronic microvascular disease. Unfortunately, I still have not received the actual imaging. It is difficult to make a determination if this is something that I would consider significantly greater than expected for age in the setting of a history of diabetes and hyperlipidemia.  We will request this again. I am less concerned as there has been no change in his abilities or exam since last visit. I would not recommend initiation acetylcholinesterase inhibitors or NMDA receptor antagonist as any cognitive decline in his present does not appear to be affecting his day-to-day life.  It may be helpful if he were to return with a family member who can provide further insight.    PLAN  Obtain MRI        Would hold off on starting any medications for cognitive decline    Return to clinic in 6 months.    Thank you for allowing me to participate in your patient's care and please do not hesitate to contact me for any questions or concerns.    Adrian Prows, DO  Assistant Professor of Neurology  [facility]     I personally spent a total of 30 minutes today preparing to see the patient, in the encounter with the patient, and documenting after the visit.    ==========================================================================================================================================    NAME:  Steven [name]  DOB:   [birth year]  VISIT DATE:  04/25/2022    CC:  Abnormal MRI    Patient seen in consultation at the request of Dr. Towanda Malkin  History obtained from the patient and chart/records  Age of patient:  67 y.o.    INTERVAL: Since last visit, he lost his son in November due to sepsis. Wife continues to struggle with health issues related to melanoma. Despite this, he continues to remain very active and still is a Social research officer, government. He has not noted any changes to his memory still has some mild difficulty with some names that will generally come to him later. No other concerns.     HPI:   I had the pleasure of seeing your patient in neurology clinic for an outpatient consultation, who is a 67 y.o. year old male who was referred for evaluation of .  Please allow me to summarize the history for the record.    He does not have any primary concerns at today's visit other than his abnormal MRI.  When asked about any cognitive issues, he denies significant decline.  He does recognize that he forgets names sometimes and potentially some recent conversations though this is not impacting his daily activities.  He is not getting lost in familiar places.  He performs his own IADLs.  He recently had an MRI which was done at [facility].  The read on this image revealed extensive chronic white matter changes.  The images not available for personal review.  Patient denies any episodes of stroke-like symptoms in the past.  He is  no other concerns today.  ============================================================================================================================================  PMHx  Patient Active Problem List   Diagnosis    Hyperlipidemia    Hypothyroidism    Type 2 diabetes mellitus (CMS HCC)     Past Surgical History:   Procedure Laterality Date    HX APPENDECTOMY           Family Medical History:       Problem Relation (Age of Onset)    Diabetes type II Mother, Father    Hypothyroidism Mother             Current Outpatient Medications   Medication Sig Dispense Refill    atorvastatin (LIPITOR) 10 mg Oral Tablet TAKE 1 TABLET BY MOUTH EVERY DAY FOR 90 DAYS      cyanocobalamin (VITAMIN B 12) 1,000 mcg Oral Tablet Take 1 Tablet (1,000 mcg total) by mouth Once a day      ergocalciferol, vitamin D2, (DRISDOL) 1,250 mcg (50,000 unit) Oral Capsule Take 1 Capsule (50,000 Units total) by mouth Every 7 days      levothyroxine (SYNTHROID) 50 mcg Oral Tablet TAKE 1 TABLET BY MOUTH EVERY DAY FOR 90 DAYS      losartan (COZAAR) 50 mg Oral Tablet TAKE 1 TABLET BY MOUTH EVERY DAY FOR 90 DAYS      metFORMIN (GLUCOPHAGE) 500 mg Oral Tablet TAKE 1 TABLET BY MOUTH EVERY DAY FOR 90 DAYS       No current facility-administered medications for this visit.     No Known Allergies  Social History     Socioeconomic History    Marital status: Married     Spouse name: Not on file    Number of children: Not on file    Years of education: Not on file    Highest education level: Not on file   Occupational History    Not on file   Tobacco Use    Smoking status: Never    Smokeless tobacco: Current     Types: Chew   Vaping Use    Vaping Use: Never used   Substance and Sexual Activity    Alcohol use: Never    Drug use: Never    Sexual activity: Not on file   Other Topics Concern    Not on file   Social History Narrative    Not on file     Social Determinants of Health     Financial Resource Strain: Not on file   Transportation Needs: Not on file   Social Connections: Not on file   Intimate Partner Violence: Not on file   Housing Stability: Not on file       ============================================================================================================================================  GENERAL EXAMINATION  BP 137/85 (Site: Left, Patient Position: Sitting, Cuff Size: Adult Large)   Pulse 96   Temp 36.6 C (97.9 F) (Temporal)   Wt 108 kg (237 lb)   SpO2 96%   BMI 39.44 kg/m   Vital signs personally reviewed  General: No acute  distress, alert  HEENT: Normocephalic, no scleral icterus  Extremities: No significant edema, No cyanosis    NEUROLOGIC EXAM  On neurological exam, patient was awake, alert and answering questions appropriately  Speech was fluent, without dysarthria or aphasia.    CN  II-XII: grossly intact    MOTOR  Bulk: normal  Abnormal Movements: none    Strength:     MRC Grading Scale   Right Left   Deltoid 5 5   Biceps 5 5   Triceps 5 5  Wrist Extension 5 5   Wrist Flexion - -   Finger Extension - -   Finger Abduction - -   Finger Flexion - -   Hip Flexion 5 5   Hip Extension - -   Hip Abduction - -   Hip Adduction - -   Knee Extension 5 5   Knee Flexion 5 5   Ankle Dorsiflexion 5 5   Ankle Plantarflexion - -   Toe Extension - -   Toe Flexion - -     REFLEXES   Right Left   Biceps 2 2   Triceps 2 2   Brachioradialis 2 2   Patellar 2 2   Achilles 0 0   Plantar - --   Hoffman - -   Pectoralis - -   Jaw Jerk - -       SENSORY  Light touch: intact throughout    GAIT  General: wide-based, otherwise largely normal    ================================================================================================================================LABS  Personal Review of prior labs is notable for:   2023  TSH normal  A1c 5.9   Lipid panel unremarkable   BMP with mildly elevated creatinine  Vitamin-D within normal limits  CBC within normal limits  IMAGING  Personal Review of imaging is notable for:   MRI of the brain without contrast February 28, 2022 -imaging not available for review.  The report indicates extensive chronic white matter disease consistent with small-vessel ischemic changes.  OTHER DIAGNOSTICS  Personal Review of other prior diagnostics is notable for:  Not applicable

## 2022-10-24 ENCOUNTER — Encounter (HOSPITAL_COMMUNITY): Payer: Self-pay | Admitting: Surgery

## 2022-10-24 ENCOUNTER — Other Ambulatory Visit: Payer: Self-pay

## 2022-10-24 ENCOUNTER — Ambulatory Visit (HOSPITAL_COMMUNITY): Payer: Medicare (Managed Care) | Admitting: Certified Registered"

## 2022-10-24 ENCOUNTER — Ambulatory Visit
Admission: RE | Admit: 2022-10-24 | Discharge: 2022-10-24 | Disposition: A | Discharge status: Home / Self Care | Payer: Medicare (Managed Care) | Source: Ambulatory Visit | Attending: Surgery | Admitting: Surgery

## 2022-10-24 ENCOUNTER — Ambulatory Visit (HOSPITAL_COMMUNITY): Payer: Medicare (Managed Care) | Admitting: Surgery

## 2022-10-24 ENCOUNTER — Encounter (HOSPITAL_COMMUNITY)
Admission: RE | Disposition: A | Discharge status: Home / Self Care | Payer: Self-pay | Source: Ambulatory Visit | Attending: Surgery

## 2022-10-24 DIAGNOSIS — I10 Essential (primary) hypertension: Secondary | ICD-10-CM | POA: Insufficient documentation

## 2022-10-24 DIAGNOSIS — E785 Hyperlipidemia, unspecified: Secondary | ICD-10-CM | POA: Insufficient documentation

## 2022-10-24 DIAGNOSIS — Z720 Tobacco use: Secondary | ICD-10-CM | POA: Insufficient documentation

## 2022-10-24 DIAGNOSIS — E039 Hypothyroidism, unspecified: Secondary | ICD-10-CM | POA: Insufficient documentation

## 2022-10-24 DIAGNOSIS — E669 Obesity, unspecified: Secondary | ICD-10-CM | POA: Insufficient documentation

## 2022-10-24 DIAGNOSIS — D126 Benign neoplasm of colon, unspecified: Secondary | ICD-10-CM | POA: Insufficient documentation

## 2022-10-24 DIAGNOSIS — E119 Type 2 diabetes mellitus without complications: Secondary | ICD-10-CM | POA: Insufficient documentation

## 2022-10-24 DIAGNOSIS — Z1211 Encounter for screening for malignant neoplasm of colon: Secondary | ICD-10-CM | POA: Insufficient documentation

## 2022-10-24 SURGERY — COLONOSCOPY WITH POLYPECTOMY
Anesthesia: General | Wound class: Clean Contaminated Wounds-The respiratory, GI, Genital, or urinary

## 2022-10-24 MED ORDER — SODIUM CHLORIDE 0.9 % INTRAVENOUS SOLUTION
INTRAVENOUS | Status: DC | PRN
Start: 2022-10-24 — End: 2022-10-24

## 2022-10-24 MED ORDER — PROPOFOL 10 MG/ML INTRAVENOUS EMULSION
Freq: Once | INTRAVENOUS | Status: DC | PRN
Start: 2022-10-24 — End: 2022-10-24
  Administered 2022-10-24: 30 mL via INTRAVENOUS

## 2022-10-24 SURGICAL SUPPLY — 1 items: FORCEPS ENDOS HOT PRCS BITE 24_0CM 2.8MM 2.2MM RJ 4 CUP DISP (ENDOSCOPIC SUPPLIES) ×1 IMPLANT

## 2022-10-24 NOTE — H&P (Signed)
[facility]  H&P Update Form    Gordie, Bamber, 67 y.o. male  Encounter Start Date:  10/24/2022  Inpatient Admission Date:   Date of Birth:  Nov 28, 1955    10/24/2022    STOP: IF H&P IS GREATER THAN 30 DAYS FROM SURGICAL DAY COMPLETE NEW H&P IS REQUIRED.     H & P updated the day of the procedure.  1.  H&P completed within 30 days of surgical procedure and has been reviewed within 24 hours of admission but prior to surgery or a procedure requiring anesthesia services by Dr.Fred Dewaine Conger MD. the patient has been examined, and no change has occured in the patients condition since the H&P was completed.       Change in medications: No                  Comments:     2.  Patient continues to be appropiate candidate for planned surgical procedure. YES      Ephriam Knuckles, MD

## 2022-10-24 NOTE — Anesthesia Preprocedure Evaluation (Signed)
ANESTHESIA PRE-OP EVALUATION  Planned Procedure: COLONOSCOPY  Review of Systems                   Pulmonary   Chews tobacco,   Cardiovascular    Hypertension and hyperlipidemia ,No peripheral edema,  Exercise Tolerance: > or = 4 METS        GI/Hepatic/Renal           Endo/Other    hypothyroidism and obesity,   type 2 diabetes    Neuro/Psych/MS   negative neuro/psych ROS,      Cancer    negative hematology/oncology ROS,                     Physical Assessment      Airway       Mallampati: IV    TM distance: 3 FB    Neck ROM: full  Mouth Opening: good.    No Beard        Dental           (+) edentulous           Pulmonary    Comment: Chews tobacco         Cardiovascular    Rhythm: regular  Rate: Normal  (-) no friction rub, carotid bruit is not present, no peripheral edema and no murmur     Other findings              Plan  ASA 3     Planned anesthesia type: general     total intravenous anesthesia                          Anesthetic plan and risks discussed with patient  signed consent obtained          Patient's NPO status is appropriate for Anesthesia.

## 2022-10-24 NOTE — Anesthesia Postprocedure Evaluation (Signed)
Anesthesia Post Op Evaluation    Patient: Steven [name]  Procedure(s):  COLONOSCOPY WITH POLYPECTOMY using hot biopsy forceps    Last Vitals:Temperature: 36.6 C (97.9 F) (10/24/22 1245)  Heart Rate: 64 (10/24/22 1245)  BP (Non-Invasive): 101/61 (10/24/22 1245)  Respiratory Rate: 16 (10/24/22 1245)  SpO2: 93 % (10/24/22 1245)    No notable events documented.    Patient is sufficiently recovered from the effects of anesthesia to participate in the evaluation and has returned to their pre-procedure level.  Patient location during evaluation: PACU       Patient participation: complete - patient participated  Level of consciousness: responsive to verbal stimuli    Pain score: 0  Pain management: adequate  Airway patency: patent    Anesthetic complications: no  Cardiovascular status: acceptable  Respiratory status: acceptable  Hydration status: acceptable  Patient post-procedure temperature: Pt Normothermic   PONV Status: Absent

## 2022-10-24 NOTE — Discharge Instructions (Signed)
SURGICAL DISCHARGE INSTRUCTIONS     Dr. Dewaine Conger, Gelene Mink, MD  performed your COLONOSCOPY WITH POLYPECTOMY using hot biopsy forceps today at the [facility]    [place]  [facility]:  Monday through Friday from 8 a.m. - 4 p.m.: [phone]    For T&D: [phone]  Between 4 p.m. - 8 a.m., weekends and holidays:  Call ER [phone]    PLEASE SEE WRITTEN HANDOUTS AS DISCUSSED BY YOUR NURSE:  DIVERTICULOSIS    SIGNS AND SYMPTOMS OF A WOUND / INCISION INFECTION   Be sure to watch for the following:  Increase in redness or red streaks near or around the wound or incision.  Increase in pain that is intense or severe and cannot be relieved by the pain medication that your doctor has given you.  Increase in swelling that cannot be relieved by elevation of a body part, or by applying ice, if permitted.  Increase in drainage, or if yellow / green in color and smells bad. This could be on a dressing or a cast.  Increase in fever for longer than 24 hours, or an increase that is higher than 101 degrees Fahrenheit (normal body temperature is 98 degrees Fahrenheit). The incision may feel warm to the touch.    **CALL YOUR DOCTOR IF ONE OR MORE OF THESE SIGNS / SYMPTOMS SHOULD OCCUR.    ANESTHESIA INFORMATION   ANESTHESIA -- ADULT PATIENTS:  You have received intravenous sedation / general anesthesia, and you may feel drowsy and light-headed for several hours. You may even experience some forgetfulness of the procedure. DO NOT DRIVE A MOTOR VEHICLE or perform any activity requiring complete alertness or coordination until you feel fully awake in about 24-48 hours. Do not drink alcoholic beverages for at least 24 hours. Do not stay alone, you must have a responsible adult available to be with you. You may also experience a dry mouth or nausea for 24 hours. This is a normal side effect and will disappear as the effects of the medication wear off.    REMEMBER   If you experience any difficulty  breathing, chest pain, bleeding that you feel is excessive, persistent nausea or vomiting or for any other concerns:  Call your physician Dr.  Ephriam Knuckles, MD   at [phone] . You may also ask to have the general doctor on call paged. They are available to you 24 hours a day.      SPECIAL INSTRUCTIONS / COMMENTS   POST-OP DIAGNOSIS--COLON POLYP, SIGMOID DIVERTICULOSIS  REST TODAY--DO NOT DRIVE OR OPERATE ELECTRICAL EQUIPMENT OR SIGN LEGAL DOCUMENTS FOR 24 HOURS   RETURN TO SEE DR Dewaine Conger AS NEEDED    Dr Loel Dubonnet  [phone]

## 2022-10-24 NOTE — OR Surgeon (Signed)
[facility]      Patient Name: [name], Steven Number: [id number]  Date of Service: 10/24/2022   Date of Birth: 06-Jul-1956      Pre-Operative Diagnosis: SCREENING     Post-Operative Diagnosis: NORMAL EXAM    Procedure(s)/Description:  COLONOSCOPY WITH POLYPECTOMY using hot biopsy forceps:      Attending Surgeon: Ephriam Knuckles, MD     Anesthesia:  CRNA: Welton Flakes, CRNA    Anesthesia Type: .General     Specimens Removed: NONE    Patient was taken to the endoscopy suite and given appropriate intravenous sedation.  Videocolonoscope was inserted into the rectum and advanced sequentially to the level of the cecum.  Cecum was confirmed by external palpation, presence of the ileocecal valve and transillumination of the light.  Picture was taken that documents this level.  Colonoscope was then subsequently withdrawn back inspecting all mucosal surfaces.  The ascending colon, transverse colon, descending colon, and sigmoid colon were all visualized with no specific abnormality, with the exception of a small polyp at 120 CM, which was taken with hot biopsy forceps.  The scope was withdrawn back to the level of the rectum and retroflexed.  The rectal anal junction visualized with no specific abnormality.  Scope was then subsequently straightened and withdrawn.  This concluded the procedure which patient tolerated well.  Based on current recommendations, patient would not need another routine diagnostic colonoscopy for 10 years barring change in presentation and/or symptoms, unless otherwise indicated by histologic exam on the polyp.    FW Dewaine Conger MD FACS

## 2022-10-25 DIAGNOSIS — K5730 Diverticulosis of large intestine without perforation or abscess without bleeding: Secondary | ICD-10-CM

## 2022-10-25 DIAGNOSIS — K635 Polyp of colon: Secondary | ICD-10-CM

## 2022-10-25 LAB — SURGICAL PATHOLOGY SPECIMEN

## 2022-11-15 ENCOUNTER — Other Ambulatory Visit: Payer: Medicare (Managed Care) | Attending: Nephrology | Admitting: Nephrology

## 2022-11-15 ENCOUNTER — Other Ambulatory Visit: Payer: Self-pay

## 2022-11-15 ENCOUNTER — Ambulatory Visit (INDEPENDENT_AMBULATORY_CARE_PROVIDER_SITE_OTHER): Payer: Medicare (Managed Care)

## 2022-11-15 DIAGNOSIS — N1831 Chronic kidney disease, stage 3a (CMS HCC): Secondary | ICD-10-CM | POA: Insufficient documentation

## 2022-11-15 DIAGNOSIS — E559 Vitamin D deficiency, unspecified: Secondary | ICD-10-CM | POA: Insufficient documentation

## 2022-11-15 LAB — BASIC METABOLIC PANEL
ANION GAP: 6 mmol/L (ref 4–13)
BUN/CREA RATIO: 10 (ref 6–22)
BUN: 14 mg/dL (ref 7–25)
CALCIUM: 9.7 mg/dL (ref 8.6–10.3)
CHLORIDE: 106 mmol/L (ref 98–107)
CO2 TOTAL: 26 mmol/L (ref 21–31)
CREATININE: 1.41 mg/dL — ABNORMAL HIGH (ref 0.60–1.30)
ESTIMATED GFR: 54 mL/min/{1.73_m2} — ABNORMAL LOW (ref >59)
GLUCOSE: 109 mg/dL (ref 74–109)
OSMOLALITY, CALCULATED: 277 mOsm/kg (ref 270–290)
POTASSIUM: 4.1 mmol/L (ref 3.5–5.1)
SODIUM: 138 mmol/L (ref 136–145)

## 2022-11-15 LAB — CBC WITH DIFF
BASOPHIL #: 0 10*3/uL (ref 0.00–0.10)
BASOPHIL %: 1 % (ref 0–1)
EOSINOPHIL #: 0.5 10*3/uL (ref 0.00–0.50)
EOSINOPHIL %: 8 %
HCT: 42.2 % (ref 36.7–47.1)
HGB: 14.5 g/dL (ref 12.5–16.3)
LYMPHOCYTE #: 1.4 10*3/uL (ref 1.00–3.00)
LYMPHOCYTE %: 21 % (ref 16–44)
MCH: 32 pg (ref 23.8–33.4)
MCHC: 34.4 g/dL (ref 32.5–36.3)
MCV: 93.2 fL (ref 73.0–96.2)
MONOCYTE #: 0.7 10*3/uL (ref 0.30–1.00)
MONOCYTE %: 11 % (ref 5–13)
MPV: 9.8 fL (ref 7.4–11.4)
NEUTROPHIL #: 4 10*3/uL (ref 1.85–7.80)
NEUTROPHIL %: 60 % (ref 43–77)
PLATELETS: 170 10*3/uL (ref 140–440)
RBC: 4.53 10*6/uL (ref 4.06–5.63)
RDW: 13.4 % (ref 12.1–16.2)
WBC: 6.7 10*3/uL (ref 3.6–10.2)

## 2022-11-15 LAB — MICROALBUMIN/CREATININE RATIO, URINE, RANDOM
CREATININE RANDOM URINE: 214 mg/dL — ABNORMAL HIGH (ref 30–125)
MICROALBUMIN RANDOM URINE: <0.7 mg/dL

## 2022-11-15 LAB — PROTEIN/CREATININE RATIO, URINE, RANDOM
CREATININE RANDOM URINE: 214 mg/dL — ABNORMAL HIGH (ref 30–125)
PROTEIN RANDOM URINE: 10 mg/dL — ABNORMAL LOW (ref 50–80)
PROTEIN/CREATININE RATIO RANDOM URINE: 0.047 mg/mg (ref 0.000–200.000)

## 2022-11-15 LAB — MAGNESIUM: MAGNESIUM: 1.7 mg/dL — ABNORMAL LOW (ref 1.9–2.7)

## 2022-11-15 LAB — VITAMIN D 25 TOTAL: VITAMIN D: 59 ng/mL (ref 30–100)

## 2022-11-15 LAB — URIC ACID: URIC ACID: 7.3 mg/dL (ref 2.3–7.6)

## 2022-11-15 LAB — PARATHYROID HORMONE (PTH): PTH: 41.3 pg/mL (ref 12.0–88.0)

## 2022-11-22 ENCOUNTER — Ambulatory Visit (INDEPENDENT_AMBULATORY_CARE_PROVIDER_SITE_OTHER): Payer: Medicare (Managed Care) | Admitting: Nephrology

## 2022-11-22 ENCOUNTER — Encounter (INDEPENDENT_AMBULATORY_CARE_PROVIDER_SITE_OTHER): Payer: Self-pay

## 2022-11-22 ENCOUNTER — Other Ambulatory Visit: Payer: Self-pay

## 2022-11-22 VITALS — BP 128/67 | HR 63 | Ht 67.0 in | Wt 240.3 lb

## 2022-11-22 DIAGNOSIS — E559 Vitamin D deficiency, unspecified: Secondary | ICD-10-CM

## 2022-11-22 DIAGNOSIS — I129 Hypertensive chronic kidney disease with stage 1 through stage 4 chronic kidney disease, or unspecified chronic kidney disease: Secondary | ICD-10-CM

## 2022-11-22 DIAGNOSIS — N1831 Chronic kidney disease, stage 3a (CMS HCC): Secondary | ICD-10-CM

## 2022-11-22 DIAGNOSIS — E1122 Type 2 diabetes mellitus with diabetic chronic kidney disease: Secondary | ICD-10-CM

## 2022-11-22 DIAGNOSIS — M1711 Unilateral primary osteoarthritis, right knee: Secondary | ICD-10-CM

## 2022-11-22 NOTE — Progress Notes (Signed)
[facility]  NEPHROLOGY, [facility]ICAL [facility]    Progress Note    Name: Steven [name] MRN:  [id number]   Date: 11/22/2022 Age: 67 y.o.          Nephrology Out  Patient Visit       Reason for the visit/consultation:  CKD STAGE 3 A  HPI : 67 y.o. gentleman with past medical history of diabetes not on insulin, hypertension, arthritis was on NSAIDs, here for follow-up on chronic kidney disease stage 3  Patient denies edema denies dysuria.  Patient used to take NSAIDs for right knee arthritis.  Patient is cutting down on his soda.  Patient is not taking NSAIDs.        ROS:     Systematic review of 12 organ systems was negative except what mentioned in in the HPI.     OBJECTIVE:   BP 128/67   Pulse 63   Ht 1.702 m (5\' 7" )   Wt 109 kg (240 lb 4.8 oz)   BMI 37.64 kg/m       General:  NAD, AAOx3  HEENT:  EOMI,  no icterus  NECK: No increased JVD.  Normal inspection   HEART: Normal S1 and S2. Regular rhythm. No murmurs or rubs. No chest wall tenderness   LUNGS: Clear to auscultation bilateral. No wheezes, rales, or rhonchi.   ABDOMEN: +BS, Soft, nontender and nondistended. No rebound or guarding present.   EXTREMITIES: No edema. No cyanosis or clubbing.No asterixis    NEURO : Normal speech, EOMI.       LABORATORY DATA:   Lab Results   Component Value Date    BUN 14 11/15/2022    BUN 20 07/12/2022    CREATININE 1.41 (H) 11/15/2022    CREATININE 1.55 (H) 07/12/2022    BUNCRRATIO 10 11/15/2022    BUNCRRATIO 13 07/12/2022    GFR 54 (L) 11/15/2022    GFR 48 (L) 07/12/2022    SODIUM 138 11/15/2022    SODIUM 139 07/12/2022    POTASSIUM 4.1 11/15/2022    POTASSIUM 4.5 07/12/2022    CHLORIDE 106 11/15/2022    CHLORIDE 104 07/12/2022    CO2 26 11/15/2022    CO2 26 07/12/2022    ANIONGAP 6 11/15/2022    ANIONGAP 9 07/12/2022    CALCIUM 9.7 11/15/2022    CALCIUM 9.9 07/12/2022    HGB 14.5 11/15/2022    HGB 14.6 07/12/2022    HCT 42.2 11/15/2022    HCT 43.2 07/12/2022    INTACTPTH 41.3 11/15/2022    INTACTPTH 44.7 07/12/2022          Lab work from other lab:  Baseline creatinine 1.46/GFR 52 on 01/20/2022    MEDICATIONS:  Outpatient Medications Marked as Taking for the 11/22/22 encounter (Office Visit) with Rhina Brackett, MD   Medication Sig    atorvastatin (LIPITOR) 10 mg Oral Tablet TAKE 1 TABLET BY MOUTH EVERY DAY FOR 90 DAYS    cyanocobalamin (VITAMIN B 12) 1,000 mcg Oral Tablet Take 1 Tablet (1,000 mcg total) by mouth Once a day    ergocalciferol, vitamin D2, (DRISDOL) 1,250 mcg (50,000 unit) Oral Capsule Take 1 Capsule (50,000 Units total) by mouth Every 7 days    levothyroxine (SYNTHROID) 50 mcg Oral Tablet TAKE 1 TABLET BY MOUTH EVERY DAY FOR 90 DAYS    metFORMIN (GLUCOPHAGE) 500 mg Oral Tablet TAKE 1 TABLET BY MOUTH EVERY 3 DAYS     Current Outpatient Medications   Medication Instructions  atorvastatin (LIPITOR) 10 mg Oral Tablet TAKE 1 TABLET BY MOUTH EVERY DAY FOR 90 DAYS    cyanocobalamin (VITAMIN B 12) 1,000 mcg, Oral, DAILY    ergocalciferol (vitamin D2) (DRISDOL) 50,000 Units, Oral, EVERY 7 DAYS    levothyroxine (SYNTHROID) 50 mcg Oral Tablet TAKE 1 TABLET BY MOUTH EVERY DAY FOR 90 DAYS    metFORMIN (GLUCOPHAGE) 500 mg Oral Tablet TAKE 1 TABLET BY MOUTH EVERY 3 DAYS       ASSESSMENT / PLAN:   ENCOUNTER DIAGNOSES     ICD-10-CM   1. Stage 3a chronic kidney disease (CMS HCC)  N18.31   2. Vitamin D deficiency  E55.9            Chronic kidney disease  -Stage IIIa  -Due to diabetes and hypertension, NSAIDs.  -Creatinine is stable   1.41   -Baseline creatinine 1.46  -Total protein to creatinine ratio 0.047  -UACR 3.4  -CBC and a basic metabolic panel  -Return to clinic in 6 months  -Continue low-sodium diet  -balanced Fluid intake 40-50 oz a day.    -Avoid NSAIDs.  Tylenol only for pain  -ACEI or ARBS:  Patient is not taking losartan.  -Sodium Glucose Cotransporter-2 (SGLT2) Inhibitors:  N     CKD bone mineral disease  -PTH   PTH   Date Value Ref Range Status   11/15/2022 41.3 12.0 - 88.0 pg/mL Final      -Vitamin D level at goal  and vitamin-D supplements    Hypertension  -Blood pressure is at goal  -Goal less than 130/80  -patient is not taking losartan.  -Low-salt diet    Diabetes type 2  -A1c goal is less than 7   Lab Results   Component Value Date    HA1C 6.0 07/12/2022      -Continue current medications, continue metformin  -Diabetic diet  -Increase activity and exercise as possible  -Monitor A1C  -may benefit from [place]    Right knee arthritis  -DC meloxicam.  Tylenol only           Orders Placed This Encounter    BASIC METABOLIC PANEL    CBC/DIFF    MAGNESIUM    MICROALBUMIN/CREATININE RATIO, URINE, RANDOM    PARATHYROID HORMONE (PTH)    PROTEIN/CREATININE RATIO, URINE, RANDOM    URIC ACID    VITAMIN D 25 TOTAL        Rhina Brackett, MD

## 2023-01-16 ENCOUNTER — Ambulatory Visit (INDEPENDENT_AMBULATORY_CARE_PROVIDER_SITE_OTHER): Payer: Medicare (Managed Care) | Admitting: NEUROLOGY

## 2023-01-16 ENCOUNTER — Other Ambulatory Visit: Payer: Self-pay

## 2023-01-16 ENCOUNTER — Encounter (INDEPENDENT_AMBULATORY_CARE_PROVIDER_SITE_OTHER): Payer: Self-pay | Admitting: NEUROLOGY

## 2023-01-16 VITALS — BP 130/72 | HR 72 | Temp 98.1°F | Wt 236.0 lb

## 2023-01-16 DIAGNOSIS — G3184 Mild cognitive impairment, so stated: Secondary | ICD-10-CM

## 2023-01-16 NOTE — Progress Notes (Signed)
ASSESSMENT  MILD COGNITIVE IMPARIMENT:  He is a 67 year old man who follows-up for [facility].  Referring notes indicate concern about cognitive decline.  The patient does not have formal complaints of cognitive decline.  He does report some mild decline in terms of forgetting names. This remains unchanged from previous.  He does not report any cognitive changes that are impacting his ADLs or IADLs.   MOCA testing at initial visit revealed a score of 15/30 which would indicate a moderate-to-severe level of dysfunction. Previous MRI has shown extensive, chronic microvascular disease. Unfortunately, I still have not received the actual imaging. It is difficult to make a determination if this is something that I would consider significantly greater than expected for age in the setting of a history of diabetes and hyperlipidemia.  I am less concerned as there has been no change in his abilities or exam since last visit. I would not recommend initiation acetylcholinesterase inhibitors or NMDA receptor antagonist as any cognitive decline in his present does not appear to be affecting his day-to-day life. As he has been stable over the course of several follow-ups, I will let him follow-up as needed.    PLAN   Would hold off on starting any medications for cognitive decline         Discussed symptoms that may prompt follow-up    Return to clinic PRN    Thank you for allowing me to participate in your patient's care and please do not hesitate to contact me for any questions or concerns.    Patrick North, DO  Assistant Professor of Neurology  [place]     I personally spent a total of 20 minutes today preparing to see the patient, in the encounter with the patient, and documenting after the visit.    ==========================================================================================================================================    NAME:  Steven [name]  DOB:  03-02-1956  VISIT  DATE:  04/25/2022    CC:  Abnormal MRI    Patient seen in consultation at the request of Dr. Miguel Aschoff  History obtained from the patient and chart/records  Age of patient:  67 y.o.    INTERVAL: Since last visit, has been having occasional headaches. Feels like pressure in temples. They are worse with stress. They are very occasional and not terribly bothersome to the patient. He otherwise reports that memory is unchanged. He is still taking care of his wife who is improving. No other concerns today.    HPI:   I had the pleasure of seeing your patient in neurology clinic for an outpatient consultation, who is a 67 y.o. year old male who was referred for evaluation of .  Please allow me to summarize the history for the record.    He does not have any primary concerns at today's visit other than his abnormal MRI.  When asked about any cognitive issues, he denies significant decline.  He does recognize that he forgets names sometimes and potentially some recent conversations though this is not impacting his daily activities.  He is not getting lost in familiar places.  He performs his own IADLs.  He recently had an MRI which was done at [facility].  The read on this image revealed extensive chronic white matter changes.  The images not available for personal review.  Patient denies any episodes of stroke-like symptoms in the past.  He is no other concerns today.  ============================================================================================================================================  PMHx  Patient Active Problem List   Diagnosis  Hyperlipidemia    Hypothyroidism    Type 2 diabetes mellitus (CMS HCC)     Past Surgical History:   Procedure Laterality Date    HX APPENDECTOMY           Family Medical History:       Problem Relation (Age of Onset)    Diabetes type II Mother, Father    Hypothyroidism Mother            Current Outpatient Medications   Medication Sig Dispense Refill     atorvastatin (LIPITOR) 10 mg Oral Tablet TAKE 1 TABLET BY MOUTH EVERY DAY FOR 90 DAYS      cyanocobalamin (VITAMIN B 12) 1,000 mcg Oral Tablet Take 1 Tablet (1,000 mcg total) by mouth Once a day      ergocalciferol, vitamin D2, (DRISDOL) 1,250 mcg (50,000 unit) Oral Capsule Take 1 Capsule (50,000 Units total) by mouth Every 7 days      levothyroxine (SYNTHROID) 50 mcg Oral Tablet TAKE 1 TABLET BY MOUTH EVERY DAY FOR 90 DAYS      metFORMIN (GLUCOPHAGE) 500 mg Oral Tablet TAKE 1 TABLET BY MOUTH EVERY 3 DAYS       No current facility-administered medications for this visit.     No Known Allergies  Social History     Socioeconomic History    Marital status: Married     Spouse name: Not on file    Number of children: Not on file    Years of education: Not on file    Highest education level: Not on file   Occupational History    Not on file   Tobacco Use    Smoking status: Never    Smokeless tobacco: Current     Types: Chew   Vaping Use    Vaping status: Never Used   Substance and Sexual Activity    Alcohol use: Never    Drug use: Never    Sexual activity: Not on file   Other Topics Concern    Not on file   Social History Narrative    Not on file     Social Determinants of Health     Financial Resource Strain: Not on file   Transportation Needs: Not on file   Social Connections: Not on file   Intimate Partner Violence: Not on file   Housing Stability: Not on file       ============================================================================================================================================  GENERAL EXAMINATION  BP 130/72 (Site: Left, Patient Position: Sitting, Cuff Size: Adult)   Pulse 72   Temp 36.7 C (98.1 F) (Temporal)   Wt 107 kg (236 lb)   SpO2 93%   BMI 36.96 kg/m   Vital signs personally reviewed  General: No acute distress, alert  HEENT: Normocephalic, no scleral icterus  Extremities: No significant edema, No cyanosis    NEUROLOGIC EXAM  On neurological exam, patient was awake, alert and  answering questions appropriately  Speech was fluent, without dysarthria or aphasia.    CN  II-XII: grossly intact    MOTOR  Bulk: normal  Abnormal Movements: none    Strength:     MRC Grading Scale   Right Left   Deltoid 5 5   Biceps 5 5   Triceps 5 5   Wrist Extension - -   Wrist Flexion - -   Finger Extension - -   Finger Abduction - -   Finger Flexion - -   Hip Flexion 5 5   Hip Extension - -  Hip Abduction - -   Hip Adduction - -   Knee Extension 5 5   Knee Flexion 5 5   Ankle Dorsiflexion 5 5   Ankle Plantarflexion - -   Toe Extension - -   Toe Flexion - -     REFLEXES   Right Left   Biceps 2 2   Triceps 2 2   Brachioradialis 2 2   Patellar 2 2   Achilles 0 0   Plantar - --   Hoffman - -   Pectoralis - -   Jaw Jerk - -       SENSORY  Light touch: intact throughout    GAIT  General: wide-based, otherwise largely normal    ================================================================================================================================LABS  Personal Review of prior labs is notable for:   2024  PTH WNL   Urine cr elevated  CBC WNL   2023  TSH normal  BMP with elevated creatinine GFR 54  A1c 5.9   Lipid panel unremarkable   BMP with mildly elevated creatinine  Vitamin-D within normal limits  CBC within normal limits  IMAGING  Personal Review of imaging is notable for:   MRI of the brain without contrast February 28, 2022 -imaging not available for review.  The report indicates extensive chronic white matter disease consistent with small-vessel ischemic changes.  OTHER DIAGNOSTICS  Personal Review of other prior diagnostics is notable for:  Not applicable
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
## 2023-05-18 ENCOUNTER — Other Ambulatory Visit (INDEPENDENT_AMBULATORY_CARE_PROVIDER_SITE_OTHER): Payer: Self-pay

## 2023-05-25 ENCOUNTER — Encounter (INDEPENDENT_AMBULATORY_CARE_PROVIDER_SITE_OTHER): Payer: Self-pay

## 2023-06-06 ENCOUNTER — Other Ambulatory Visit: Payer: Self-pay

## 2023-06-06 ENCOUNTER — Other Ambulatory Visit: Payer: Medicare (Managed Care)

## 2023-06-06 DIAGNOSIS — N1831 Chronic kidney disease, stage 3a: Secondary | ICD-10-CM | POA: Insufficient documentation

## 2023-06-06 DIAGNOSIS — E559 Vitamin D deficiency, unspecified: Secondary | ICD-10-CM | POA: Insufficient documentation

## 2023-06-06 LAB — CBC WITH DIFF
BASOPHIL #: 0 10*3/uL (ref 0.00–0.10)
BASOPHIL %: 1 % (ref 0–1)
EOSINOPHIL #: 0.4 10*3/uL (ref 0.00–0.50)
EOSINOPHIL %: 6 % (ref 1–8)
HCT: 43.4 % (ref 36.7–47.1)
HGB: 14.8 g/dL (ref 12.5–16.3)
LYMPHOCYTE #: 1.8 10*3/uL (ref 1.00–3.00)
LYMPHOCYTE %: 26 % (ref 16–44)
MCH: 31.8 pg (ref 23.8–33.4)
MCHC: 34.1 g/dL (ref 32.5–36.3)
MCV: 93.2 fL (ref 73.0–96.2)
MONOCYTE #: 0.7 10*3/uL (ref 0.30–1.00)
MONOCYTE %: 10 % (ref 5–13)
MPV: 9.5 fL (ref 7.4–11.4)
NEUTROPHIL #: 3.9 10*3/uL (ref 1.85–7.80)
NEUTROPHIL %: 57 % (ref 43–77)
PLATELETS: 170 10*3/uL (ref 140–440)
RBC: 4.66 10*6/uL (ref 4.06–5.63)
RDW: 14.3 % (ref 12.1–16.2)
WBC: 6.9 10*3/uL (ref 3.6–10.2)

## 2023-06-06 LAB — BASIC METABOLIC PANEL
ANION GAP: 7 mmol/L (ref 4–13)
BUN/CREA RATIO: 9 (ref 6–22)
BUN: 13 mg/dL (ref 7–25)
CALCIUM: 9.8 mg/dL (ref 8.6–10.3)
CHLORIDE: 105 mmol/L (ref 98–107)
CO2 TOTAL: 28 mmol/L (ref 21–31)
CREATININE: 1.39 mg/dL — ABNORMAL HIGH (ref 0.60–1.30)
ESTIMATED GFR: 55 mL/min/{1.73_m2} — ABNORMAL LOW (ref >59)
GLUCOSE: 108 mg/dL (ref 74–109)
OSMOLALITY, CALCULATED: 280 mosm/kg (ref 270–290)
POTASSIUM: 4 mmol/L (ref 3.5–5.1)
SODIUM: 140 mmol/L (ref 136–145)

## 2023-06-06 LAB — MICROALBUMIN/CREATININE RATIO, URINE, RANDOM
CREATININE RANDOM URINE: 180 mg/dL — ABNORMAL HIGH (ref 30–125)
MICROALBUMIN RANDOM URINE: <0.7 mg/dL

## 2023-06-06 LAB — VITAMIN D 25 TOTAL: VITAMIN D 25, TOTAL: 64.08 ng/mL (ref 30.00–100.00)

## 2023-06-06 LAB — MAGNESIUM: MAGNESIUM: 1.9 mg/dL (ref 1.9–2.7)

## 2023-06-06 LAB — PROTEIN/CREATININE RATIO, URINE, RANDOM
CREATININE RANDOM URINE: 180 mg/dL — ABNORMAL HIGH (ref 30–125)
PROTEIN RANDOM URINE: 8 mg/dL — ABNORMAL LOW (ref 50–80)
PROTEIN/CREATININE RATIO RANDOM URINE: 0.044 mg/mg (ref 0.000–200.000)

## 2023-06-06 LAB — URIC ACID: URIC ACID: 7.4 mg/dL (ref 2.3–7.6)

## 2023-06-06 LAB — PARATHYROID HORMONE (PTH): PTH: 69 pg/mL (ref 12.0–88.0)

## 2023-06-13 ENCOUNTER — Other Ambulatory Visit: Payer: Self-pay

## 2023-06-13 ENCOUNTER — Ambulatory Visit (INDEPENDENT_AMBULATORY_CARE_PROVIDER_SITE_OTHER): Payer: MEDICAID

## 2023-06-13 ENCOUNTER — Encounter (INDEPENDENT_AMBULATORY_CARE_PROVIDER_SITE_OTHER): Payer: Self-pay

## 2023-06-13 VITALS — BP 113/72 | HR 63 | Ht 67.0 in | Wt 245.0 lb

## 2023-06-13 DIAGNOSIS — N1831 Chronic kidney disease, stage 3a (CMS HCC): Secondary | ICD-10-CM

## 2023-06-13 DIAGNOSIS — I129 Hypertensive chronic kidney disease with stage 1 through stage 4 chronic kidney disease, or unspecified chronic kidney disease: Secondary | ICD-10-CM

## 2023-06-13 DIAGNOSIS — M1711 Unilateral primary osteoarthritis, right knee: Secondary | ICD-10-CM

## 2023-06-13 DIAGNOSIS — Z7984 Long term (current) use of oral hypoglycemic drugs: Secondary | ICD-10-CM

## 2023-06-13 DIAGNOSIS — Z791 Long term (current) use of non-steroidal anti-inflammatories (NSAID): Secondary | ICD-10-CM

## 2023-06-13 DIAGNOSIS — E559 Vitamin D deficiency, unspecified: Secondary | ICD-10-CM

## 2023-06-13 DIAGNOSIS — E1122 Type 2 diabetes mellitus with diabetic chronic kidney disease: Secondary | ICD-10-CM

## 2023-06-13 NOTE — Progress Notes (Signed)
Steven [name]  NEPHROLOGY, [facility]    Progress Note    Name: Steven [name] MRN:  [id number]   Date: 06/13/2023 Age: 67 y.o.          Nephrology Out  Patient Visit         HPI : 67 y.o. gentleman  here for a follow up  chronic kidney disease stage IIIA: Creatinine 1.39 GFR 55.  Past medical history chronic kidney disease, diabetic non-insulin-dependent, hypertension, arthritis was on NSAIDs.  Denies current NSAID use.  Reports no problems with edema.  No edema noted today.  States he is drinking adequate amounts of fluids.  He is avoiding dark sodas.  Denies nausea vomiting and diarrhea.  Blood pressure is at goal.  He is currently taking metformin 500 mg.  He was last hemoglobin A1c 6.0.  His blood pressure is in good control.           ROS:  Assessment  was negative except what mentioned in in the HPI.     OBJECTIVE:   BP 113/72   Pulse 63   Ht 1.702 m (5\' 7" )   Wt 111 kg (245 lb)   BMI 38.37 kg/m       General:  NAD, AAOx3  HEENT:  EOMI,  no icterus  NECK: No increased JVD.  Normal inspection   HEART:   Regular rhythm. No murmurs or rubs. No chest wall tenderness   LUNGS: Clear to auscultation bilateral. No wheezes, rales, or rhonchi.   ABDOMEN: +BS, Soft, nontender and nondistended. No rebound or guarding present.   EXTREMITIES: No edema. No cyanosis or clubbing.No asterixis    NEURO : Normal speech, EOMI.       LABORATORY DATA:   Lab Results   Component Value Date    BUN 13 06/06/2023    BUN 14 11/15/2022    BUN 20 07/12/2022    CREATININE 1.39 (H) 06/06/2023    CREATININE 1.41 (H) 11/15/2022    CREATININE 1.55 (H) 07/12/2022    BUNCRRATIO 9 06/06/2023    BUNCRRATIO 10 11/15/2022    BUNCRRATIO 13 07/12/2022    GFR 55 (L) 06/06/2023    GFR 54 (L) 11/15/2022    GFR 48 (L) 07/12/2022    SODIUM 140 06/06/2023    SODIUM 138 11/15/2022    SODIUM 139 07/12/2022    POTASSIUM 4.0 06/06/2023    POTASSIUM 4.1 11/15/2022    POTASSIUM 4.5 07/12/2022    CHLORIDE 105 06/06/2023    CHLORIDE 106 11/15/2022     CHLORIDE 104 07/12/2022    CO2 28 06/06/2023    CO2 26 11/15/2022    CO2 26 07/12/2022    ANIONGAP 7 06/06/2023    ANIONGAP 6 11/15/2022    ANIONGAP 9 07/12/2022    CALCIUM 9.8 06/06/2023    CALCIUM 9.7 11/15/2022    CALCIUM 9.9 07/12/2022    HGB 14.8 06/06/2023    HGB 14.5 11/15/2022    HGB 14.6 07/12/2022    HCT 43.4 06/06/2023    HCT 42.2 11/15/2022    HCT 43.2 07/12/2022    INTACTPTH 69.0 06/06/2023    INTACTPTH 41.3 11/15/2022    INTACTPTH 44.7 07/12/2022         Lab work from other lab:  Baseline creatinine 1.46/GFR 52 on 01/20/2022    MEDICATIONS:  No outpatient medications have been marked as taking for the 06/13/23 encounter (Office Visit) with Cydney Ok, NP.     Current Outpatient Medications  Medication Instructions    atorvastatin (LIPITOR) 10 mg Oral Tablet TAKE 1 TABLET BY MOUTH EVERY DAY FOR 90 DAYS    cyanocobalamin (VITAMIN B 12) 1,000 mcg, Oral, DAILY    ergocalciferol (vitamin D2) (DRISDOL) 50,000 Units, Oral, EVERY 7 DAYS    levothyroxine (SYNTHROID) 50 mcg Oral Tablet TAKE 1 TABLET BY MOUTH EVERY DAY FOR 90 DAYS    metFORMIN (GLUCOPHAGE) 500 mg Oral Tablet TAKE 1 TABLET BY MOUTH EVERY 3 DAYS       ASSESSMENT / PLAN:   ENCOUNTER DIAGNOSES     ICD-10-CM   1. Stage 3a chronic kidney disease (CMS HCC)  N18.31   2. Vitamin D deficiency  E55.9            Chronic kidney disease  -Stage IIIa  -Due to diabetes and hypertension, NSAIDs.  -Creatinine is 1.39/GFR 55  -Baseline creatinine 1.46  -Total protein to creatinine ratio 0.044  -CBC and a basic metabolic panel  -Return to clinic in 6 months  -Continue low-sodium diet  -balanced Fluid intake 40-50 oz a day.    -Avoid NSAIDs.  Tylenol only for pain  -ACEI or ARBS:  Patient is not taking losartan.  -Sodium Glucose Cotransporter-2 (SGLT2) Inhibitors:  N     CKD bone mineral disease  -PTH   PTH   Date Value Ref Range Status   06/06/2023 69.0 12.0 - 88.0 pg/mL Final      -Vitamin D level acceptable  -hold vitamin-D supplements for now  -monitor  vitamin-D level      Hypertension  -Blood pressure is at goal  -Goal less than 130/80  -patient is not taking losartan.  -Low-salt diet    Diabetes type 2  -A1c goal is less than 7   Lab Results   Component Value Date    HA1C 6.0 07/12/2022      -Continue current medications, continue metformin  -Diabetic diet  -Increase activity and exercise as possible  -Monitor A1C  -may benefit from [place]    Right knee arthritis  -DC meloxicam.  Tylenol only           Orders Placed This Encounter    BASIC METABOLIC PANEL    CBC/DIFF    MAGNESIUM    MICROALBUMIN/CREATININE RATIO, URINE, RANDOM    PARATHYROID HORMONE (PTH)    PROTEIN/CREATININE RATIO, URINE, RANDOM    VITAMIN D 25 TOTAL    URIC ACID

## 2023-11-27 ENCOUNTER — Other Ambulatory Visit (INDEPENDENT_AMBULATORY_CARE_PROVIDER_SITE_OTHER): Payer: Medicare (Managed Care)

## 2023-11-27 ENCOUNTER — Ambulatory Visit: Payer: Medicare (Managed Care)

## 2023-11-27 ENCOUNTER — Other Ambulatory Visit: Payer: Self-pay

## 2023-11-27 DIAGNOSIS — E559 Vitamin D deficiency, unspecified: Secondary | ICD-10-CM | POA: Insufficient documentation

## 2023-11-27 DIAGNOSIS — N1831 Chronic kidney disease, stage 3a: Secondary | ICD-10-CM | POA: Insufficient documentation

## 2023-11-27 LAB — PARATHYROID HORMONE (PTH): PTH: 52.7 pg/mL (ref 12.0–88.0)

## 2023-11-27 LAB — BASIC METABOLIC PANEL
ANION GAP: 6 mmol/L (ref 4–13)
BUN/CREA RATIO: 14 (ref 6–22)
BUN: 22 mg/dL (ref 7–25)
CALCIUM: 9.6 mg/dL (ref 8.6–10.3)
CHLORIDE: 104 mmol/L (ref 98–107)
CO2 TOTAL: 28 mmol/L (ref 21–31)
CREATININE: 1.62 mg/dL — ABNORMAL HIGH (ref 0.60–1.30)
ESTIMATED GFR: 46 mL/min/{1.73_m2} — ABNORMAL LOW (ref >59)
GLUCOSE: 107 mg/dL (ref 74–109)
OSMOLALITY, CALCULATED: 280 mosm/kg (ref 270–290)
POTASSIUM: 4.7 mmol/L (ref 3.5–5.1)
SODIUM: 138 mmol/L (ref 136–145)

## 2023-11-27 LAB — PROTEIN/CREATININE RATIO, URINE, RANDOM
CREATININE RANDOM URINE: 182 mg/dL — ABNORMAL HIGH (ref 30–125)
PROTEIN RANDOM URINE: 9 mg/dL — ABNORMAL LOW (ref 50–80)
PROTEIN/CREATININE RATIO RANDOM URINE: 0.049 mg/mg (ref 0.000–200.000)

## 2023-11-27 LAB — CBC WITH DIFF
BASOPHIL #: 0 10*3/uL (ref 0.00–0.10)
BASOPHIL %: 1 % (ref 0–1)
EOSINOPHIL #: 0.6 10*3/uL (ref 0.00–0.60)
EOSINOPHIL %: 8 % (ref 1–8)
HCT: 44 % (ref 36.7–47.1)
HGB: 15 g/dL (ref 12.5–16.3)
LYMPHOCYTE #: 1.5 10*3/uL (ref 1.00–3.00)
LYMPHOCYTE %: 22 % (ref 15–43)
MCH: 31.9 pg (ref 23.8–33.4)
MCHC: 34.1 g/dL (ref 32.5–36.3)
MCV: 93.5 fL (ref 73.0–96.2)
MONOCYTE #: 0.7 10*3/uL (ref 0.30–1.10)
MONOCYTE %: 10 % (ref 6–14)
MPV: 9.9 fL (ref 7.4–11.4)
NEUTROPHIL #: 4.1 10*3/uL (ref 1.70–7.60)
NEUTROPHIL %: 59 % (ref 44–74)
PLATELETS: 198 10*3/uL (ref 140–440)
RBC: 4.7 10*6/uL (ref 4.06–5.63)
RDW: 14.4 % (ref 12.1–16.2)
WBC: 6.9 10*3/uL (ref 3.6–10.2)

## 2023-11-27 LAB — MICROALBUMIN/CREATININE RATIO, URINE, RANDOM
CREATININE RANDOM URINE: 182 mg/dL — ABNORMAL HIGH (ref 30–125)
MICROALBUMIN RANDOM URINE: <0.7 mg/dL

## 2023-11-27 LAB — MAGNESIUM: MAGNESIUM: 1.9 mg/dL (ref 1.9–2.7)

## 2023-11-27 LAB — URIC ACID: URIC ACID: 8.1 mg/dL — ABNORMAL HIGH (ref 2.3–7.6)

## 2023-11-27 LAB — VITAMIN D 25 TOTAL: VITAMIN D 25, TOTAL: 49.42 ng/mL (ref 30.00–100.00)

## 2023-12-05 ENCOUNTER — Other Ambulatory Visit (INDEPENDENT_AMBULATORY_CARE_PROVIDER_SITE_OTHER): Payer: Self-pay

## 2023-12-12 ENCOUNTER — Ambulatory Visit (INDEPENDENT_AMBULATORY_CARE_PROVIDER_SITE_OTHER): Payer: Medicare (Managed Care)

## 2023-12-12 ENCOUNTER — Other Ambulatory Visit: Payer: Self-pay

## 2023-12-12 ENCOUNTER — Encounter (INDEPENDENT_AMBULATORY_CARE_PROVIDER_SITE_OTHER): Payer: Self-pay

## 2023-12-12 VITALS — BP 141/71 | HR 60 | Ht 67.0 in | Wt 246.0 lb

## 2023-12-12 DIAGNOSIS — I129 Hypertensive chronic kidney disease with stage 1 through stage 4 chronic kidney disease, or unspecified chronic kidney disease: Secondary | ICD-10-CM

## 2023-12-12 DIAGNOSIS — Z791 Long term (current) use of non-steroidal anti-inflammatories (NSAID): Secondary | ICD-10-CM

## 2023-12-12 DIAGNOSIS — E559 Vitamin D deficiency, unspecified: Secondary | ICD-10-CM

## 2023-12-12 DIAGNOSIS — E1122 Type 2 diabetes mellitus with diabetic chronic kidney disease: Secondary | ICD-10-CM

## 2023-12-12 DIAGNOSIS — Z7984 Long term (current) use of oral hypoglycemic drugs: Secondary | ICD-10-CM

## 2023-12-12 DIAGNOSIS — I10 Essential (primary) hypertension: Secondary | ICD-10-CM

## 2023-12-12 DIAGNOSIS — N1831 Chronic kidney disease, stage 3a: Secondary | ICD-10-CM

## 2023-12-12 DIAGNOSIS — M1711 Unilateral primary osteoarthritis, right knee: Secondary | ICD-10-CM

## 2023-12-12 MED ORDER — DAPAGLIFLOZIN PROPANEDIOL 10 MG TABLET
10.0000 mg | ORAL_TABLET | Freq: Every day | ORAL | 3 refills | Status: DC
Start: 2023-12-12 — End: 2024-03-14

## 2023-12-12 NOTE — Nursing Note (Signed)
Follow up lab

## 2023-12-12 NOTE — Progress Notes (Unsigned)
 [facility]  NEPHROLOGY, [facility]    Progress Note    Name: Steven [name] MRN:  [id number]   Date: 12/12/2023 Age: 67 y.o.          Nephrology Out  Patient Visit         HPI : 68 y.o. gentleman  here for a follow up  chronic kidney disease: Creatinine 1.62 GFR 46 CKD stage  IIIa . Past medical history chronic kidney disease, diabetic non-insulin-dependent, hypertension, arthritis was on NSAIDs.  Reports no problems with edema.  No edema noted today.    Blood pressure is at goal.  He is currently taking metformin 500 mg.  He was last hemoglobin A1c 6.0.  His blood pressure is in good control.  Denies nausea and vomiting.  Education provided on new medication Farxiga benefits and potential side effects.  Education provided regarding Voltaren 75 mg twice daily and side effects with kidney disease.           ROS:  Assessment  was negative except what mentioned in in the HPI.       OBJECTIVE:   BP (!) 141/71   Pulse 60   Ht 1.702 m (5\' 7" )   Wt 112 kg (246 lb)   BMI 38.53 kg/m       General:  NAD, AAOx3  HEENT:  EOMI,  no icterus  NECK: No increased JVD.  Normal inspection   HEART:   Regular rhythm. No murmurs or rubs. No chest wall tenderness   LUNGS: Clear to auscultation bilateral. No wheezes, rales, or rhonchi.   ABDOMEN: +BS, Soft, nontender and nondistended. No rebound or guarding present.   EXTREMITIES: No edema. No cyanosis or clubbing.No asterixis    NEURO : Normal speech, EOMI.       LABORATORY DATA:   Lab Results   Component Value Date    BUN 22 11/27/2023    BUN 13 06/06/2023    BUN 14 11/15/2022    CREATININE 1.62 (H) 11/27/2023    CREATININE 1.39 (H) 06/06/2023    CREATININE 1.41 (H) 11/15/2022    BUNCRRATIO 14 11/27/2023    BUNCRRATIO 9 06/06/2023    BUNCRRATIO 10 11/15/2022    GFR 46 (L) 11/27/2023    GFR 55 (L) 06/06/2023    GFR 54 (L) 11/15/2022    SODIUM 138 11/27/2023    SODIUM 140 06/06/2023    SODIUM 138 11/15/2022    POTASSIUM 4.7 11/27/2023    POTASSIUM 4.0 06/06/2023     POTASSIUM 4.1 11/15/2022    CHLORIDE 104 11/27/2023    CHLORIDE 105 06/06/2023    CHLORIDE 106 11/15/2022    CO2 28 11/27/2023    CO2 28 06/06/2023    CO2 26 11/15/2022    ANIONGAP 6 11/27/2023    ANIONGAP 7 06/06/2023    ANIONGAP 6 11/15/2022    CALCIUM 9.6 11/27/2023    CALCIUM 9.8 06/06/2023    CALCIUM 9.7 11/15/2022    HGB 15.0 11/27/2023    HGB 14.8 06/06/2023    HGB 14.5 11/15/2022    HCT 44.0 11/27/2023    HCT 43.4 06/06/2023    HCT 42.2 11/15/2022    INTACTPTH 52.7 11/27/2023    INTACTPTH 69.0 06/06/2023    INTACTPTH 41.3 11/15/2022         Lab work from other lab:  Baseline creatinine 1.46/GFR 52 on 01/20/2022    MEDICATIONS:  Outpatient Medications Marked as Taking for the 12/12/23 encounter (Office Visit) with [name],  Jullie Oiler, NP   Medication Sig    aspirin (ECOTRIN) 81 mg Oral Tablet, Delayed Release (E.C.) Take 1 Tablet (81 mg total) by mouth Daily    atorvastatin (LIPITOR) 10 mg Oral Tablet TAKE 1 TABLET BY MOUTH EVERY DAY FOR 90 DAYS    cyanocobalamin (VITAMIN B 12) 1,000 mcg Oral Tablet Take 1 Tablet (1,000 mcg total) by mouth Daily    diclofenac sodium (VOLTAREN) 75 mg Oral Tablet, Delayed Release (E.C.) Take 1 Tablet (75 mg total) by mouth Twice daily    ergocalciferol , vitamin D2, (DRISDOL) 1,250 mcg (50,000 unit) Oral Capsule Take 1 Capsule (50,000 Units total) by mouth Every 7 days    levothyroxine (SYNTHROID) 50 mcg Oral Tablet TAKE 1 TABLET BY MOUTH EVERY DAY FOR 90 DAYS    losartan (COZAAR) 50 mg Oral Tablet Take 1 Tablet (50 mg total) by mouth Daily    metFORMIN (GLUCOPHAGE) 500 mg Oral Tablet TAKE 1 TABLET BY MOUTH EVERY 3 DAYS     Current Outpatient Medications   Medication Instructions    aspirin (ECOTRIN) 81 mg, Daily    atorvastatin (LIPITOR) 10 mg Oral Tablet TAKE 1 TABLET BY MOUTH EVERY DAY FOR 90 DAYS    cyanocobalamin (VITAMIN B 12) 1,000 mcg, Daily    diclofenac sodium (VOLTAREN) 75 mg, 2 TIMES DAILY    ergocalciferol  (vitamin D2) (DRISDOL) 50,000 Units, EVERY 7 DAYS     levothyroxine (SYNTHROID) 50 mcg Oral Tablet TAKE 1 TABLET BY MOUTH EVERY DAY FOR 90 DAYS    losartan (COZAAR) 50 mg, Daily    metFORMIN (GLUCOPHAGE) 500 mg Oral Tablet TAKE 1 TABLET BY MOUTH EVERY 3 DAYS       ASSESSMENT / PLAN:   ENCOUNTER DIAGNOSES     ICD-10-CM   1. Stage 3a chronic kidney disease (CMS HCC)  N18.31   2. Essential (primary) hypertension  I10   3. Vitamin D  deficiency  E55.9            Chronic kidney disease  -Stage IIIa  -Due to diabetes and hypertension, NSAIDs.  -Creatinine is 1.39/GFR 55  -Baseline creatinine 1.46  -Total protein to creatinine ratio 0.044  -CBC and a basic metabolic panel  -Return to clinic in 6 months  -Continue low-sodium diet  -balanced Fluid intake 40-50 oz a day.    -Avoid NSAIDs.  Tylenol only for pain  -ACEI or ARBS:  Patient is not taking losartan.  -Sodium Glucose Cotransporter-2 (SGLT2) Inhibitors:   -Farxiga 10 mg daily.        CKD bone mineral disease  -PTH   PTH   Date Value Ref Range Status   11/27/2023 52.7 12.0 - 88.0 pg/mL Final      -Vitamin D  level acceptable  -monitor vitamin-D level        Hypertension  -Blood pressure is at goal  -Goal less than 130/80  -losartan 50 mg daily  -Farxiga 10 mg daily  -Low-salt diet      Diabetes type 2  -A1c goal is less than 7   Lab Results   Component Value Date    HA1C 6.0 07/12/2022      -Continue metformin decrease to 250 mg daily  -start Farxiga 10 mg daily  -Diabetic diet  -Increase activity and exercise as possible  -Monitor A1C  -may benefit from [place]    Right knee arthritis  -DC meloxicam.  Tylenol only           Orders Placed This Encounter    BASIC  METABOLIC PANEL    CBC/DIFF    MAGNESIUM    VITAMIN D  25 TOTAL    URIC ACID    PROTEIN/CREATININE RATIO, URINE, RANDOM    PARATHYROID  HORMONE (PTH)    MICROALBUMIN/CREATININE RATIO, URINE, RANDOM

## 2023-12-25 NOTE — Addendum Note (Signed)
 Addended by: [facility] D on: 12/25/2023 04:25 PM     Modules accepted: Orders

## 2024-03-06 ENCOUNTER — Ambulatory Visit (INDEPENDENT_AMBULATORY_CARE_PROVIDER_SITE_OTHER): Payer: Medicare (Managed Care)

## 2024-03-06 ENCOUNTER — Other Ambulatory Visit: Payer: Medicare (Managed Care)

## 2024-03-06 ENCOUNTER — Other Ambulatory Visit: Payer: Self-pay

## 2024-03-06 DIAGNOSIS — E559 Vitamin D deficiency, unspecified: Secondary | ICD-10-CM | POA: Insufficient documentation

## 2024-03-06 DIAGNOSIS — I10 Essential (primary) hypertension: Secondary | ICD-10-CM | POA: Insufficient documentation

## 2024-03-06 DIAGNOSIS — N1831 Chronic kidney disease, stage 3a: Secondary | ICD-10-CM | POA: Insufficient documentation

## 2024-03-06 DIAGNOSIS — I129 Hypertensive chronic kidney disease with stage 1 through stage 4 chronic kidney disease, or unspecified chronic kidney disease: Secondary | ICD-10-CM

## 2024-03-06 LAB — VITAMIN D 25 TOTAL: VITAMIN D 25, TOTAL: 61.25 ng/mL (ref 30.00–100.00)

## 2024-03-06 LAB — CBC WITH DIFF
BASOPHIL #: 0 x10ˆ3/uL (ref 0.00–0.10)
BASOPHIL %: 1 % (ref 0–1)
EOSINOPHIL #: 0.7 x10ˆ3/uL — ABNORMAL HIGH (ref 0.00–0.60)
EOSINOPHIL %: 10 % — ABNORMAL HIGH (ref 1–8)
HCT: 42.8 % (ref 36.7–47.1)
HGB: 14.7 g/dL (ref 12.5–16.3)
LYMPHOCYTE #: 1.8 x10ˆ3/uL (ref 1.00–3.00)
LYMPHOCYTE %: 26 % (ref 15–43)
MCH: 31.8 pg (ref 23.8–33.4)
MCHC: 34.3 g/dL (ref 32.5–36.3)
MCV: 92.7 fL (ref 73.0–96.2)
MONOCYTE #: 0.6 x10ˆ3/uL (ref 0.30–1.10)
MONOCYTE %: 8 % (ref 6–14)
MPV: 9.9 fL (ref 7.4–11.4)
NEUTROPHIL #: 3.7 x10ˆ3/uL (ref 1.85–7.84)
NEUTROPHIL %: 55 % (ref 44–74)
PLATELETS: 188 x10ˆ3/uL (ref 140–440)
RBC: 4.62 x10ˆ6/uL (ref 4.06–5.63)
RDW: 13.9 % (ref 12.1–16.2)
WBC: 6.8 x10ˆ3/uL (ref 3.6–10.2)

## 2024-03-06 LAB — BASIC METABOLIC PANEL
ANION GAP: 8 mmol/L (ref 4–13)
BUN/CREA RATIO: 10 (ref 6–22)
BUN: 19 mg/dL (ref 7–25)
CALCIUM: 9.8 mg/dL (ref 8.6–10.3)
CHLORIDE: 103 mmol/L (ref 98–107)
CO2 TOTAL: 27 mmol/L (ref 21–31)
CREATININE: 1.84 mg/dL — ABNORMAL HIGH (ref 0.60–1.30)
ESTIMATED GFR: 39 mL/min/1.73mˆ2 — ABNORMAL LOW (ref >59)
GLUCOSE: 115 mg/dL — ABNORMAL HIGH (ref 74–109)
OSMOLALITY, CALCULATED: 279 mosm/kg (ref 270–290)
POTASSIUM: 4.4 mmol/L (ref 3.5–5.1)
SODIUM: 138 mmol/L (ref 136–145)

## 2024-03-06 LAB — PROTEIN/CREATININE RATIO, URINE, RANDOM
CREATININE RANDOM URINE: 106 mg/dL (ref 30–125)
PROTEIN RANDOM URINE: 7 mg/dL — ABNORMAL LOW (ref 50–80)
PROTEIN/CREATININE RATIO RANDOM URINE: 0.066 mg/mg (ref 0.000–200.000)

## 2024-03-06 LAB — MAGNESIUM: MAGNESIUM: 2 mg/dL (ref 1.9–2.7)

## 2024-03-06 LAB — MICROALBUMIN/CREATININE RATIO, URINE, RANDOM
CREATININE RANDOM URINE: 106 mg/dL (ref 30–125)
MICROALBUMIN RANDOM URINE: <0.7 mg/dL

## 2024-03-06 LAB — PARATHYROID HORMONE (PTH): PTH: 40.4 pg/mL (ref 12.0–88.0)

## 2024-03-06 LAB — URIC ACID: URIC ACID: 7.4 mg/dL (ref 2.3–7.6)

## 2024-03-13 ENCOUNTER — Encounter (INDEPENDENT_AMBULATORY_CARE_PROVIDER_SITE_OTHER): Payer: Self-pay

## 2024-03-13 ENCOUNTER — Other Ambulatory Visit: Payer: Self-pay

## 2024-03-13 ENCOUNTER — Ambulatory Visit (INDEPENDENT_AMBULATORY_CARE_PROVIDER_SITE_OTHER): Payer: Medicare (Managed Care)

## 2024-03-13 VITALS — BP 102/71 | HR 79 | Ht 67.0 in | Wt 230.0 lb

## 2024-03-13 DIAGNOSIS — I129 Hypertensive chronic kidney disease with stage 1 through stage 4 chronic kidney disease, or unspecified chronic kidney disease: Secondary | ICD-10-CM

## 2024-03-13 DIAGNOSIS — E559 Vitamin D deficiency, unspecified: Secondary | ICD-10-CM

## 2024-03-13 DIAGNOSIS — E1122 Type 2 diabetes mellitus with diabetic chronic kidney disease: Secondary | ICD-10-CM

## 2024-03-13 DIAGNOSIS — E119 Type 2 diabetes mellitus without complications: Secondary | ICD-10-CM

## 2024-03-13 DIAGNOSIS — Z7984 Long term (current) use of oral hypoglycemic drugs: Secondary | ICD-10-CM

## 2024-03-13 DIAGNOSIS — N1832 Chronic kidney disease, stage 3b: Secondary | ICD-10-CM

## 2024-03-13 DIAGNOSIS — I10 Essential (primary) hypertension: Secondary | ICD-10-CM

## 2024-03-13 NOTE — Progress Notes (Unsigned)
 [place] [facility]  NEPHROLOGY, [facility]    Progress Note    Name: Steven [name] MRN:  [id number]   Date: 03/13/2024 Age: 68 y.o.          Nephrology Out  Patient Visit         HPI : 68 y.o. gentleman  here for a follow up  chronic kidney disease: Creatinine 1.84 GFR 39 CKD stage  IIIb. Past medical history chronic kidney disease, diabetic non-insulin-dependent, hypertension, arthritis was on NSAIDs. States he was taking Celebrex. Eduction provided, Denies hematuria and dysuria. Blood pressure at goal.  Hemoglobin A1AC 6.0.         ROS:  Assessment  was negative except what mentioned in in the HPI.       OBJECTIVE:   BP 102/71   Pulse 79   Ht 1.702 m (5' 7)   Wt 104 kg (230 lb)   BMI 36.02 kg/m       General:  NAD, AAOx3  HEENT:  EOMI,  no icterus  NECK: No increased JVD.  Normal inspection   HEART:   Regular rhythm. No murmurs or rubs. No chest wall tenderness   LUNGS: Clear to auscultation bilateral. No wheezes, rales, or rhonchi.   ABDOMEN: +BS, Soft, nontender and nondistended. No rebound or guarding present.   EXTREMITIES: No edema. No cyanosis or clubbing.No asterixis    NEURO : Normal speech, EOMI.       LABORATORY DATA:   Lab Results   Component Value Date    BUN 19 03/06/2024    BUN 22 11/27/2023    BUN 13 06/06/2023    CREATININE 1.84 (H) 03/06/2024    CREATININE 1.62 (H) 11/27/2023    CREATININE 1.39 (H) 06/06/2023    BUNCRRATIO 10 03/06/2024    BUNCRRATIO 14 11/27/2023    BUNCRRATIO 9 06/06/2023    GFR 39 (L) 03/06/2024    GFR 46 (L) 11/27/2023    GFR 55 (L) 06/06/2023    SODIUM 138 03/06/2024    SODIUM 138 11/27/2023    SODIUM 140 06/06/2023    POTASSIUM 4.4 03/06/2024    POTASSIUM 4.7 11/27/2023    POTASSIUM 4.0 06/06/2023    CHLORIDE 103 03/06/2024    CHLORIDE 104 11/27/2023    CHLORIDE 105 06/06/2023    CO2 27 03/06/2024    CO2 28 11/27/2023    CO2 28 06/06/2023    ANIONGAP 8 03/06/2024    ANIONGAP 6 11/27/2023    ANIONGAP 7 06/06/2023    CALCIUM 9.8 03/06/2024    CALCIUM 9.6  11/27/2023    CALCIUM 9.8 06/06/2023    HGB 14.7 03/06/2024    HGB 15.0 11/27/2023    HGB 14.8 06/06/2023    HCT 42.8 03/06/2024    HCT 44.0 11/27/2023    HCT 43.4 06/06/2023    INTACTPTH 40.4 03/06/2024    INTACTPTH 52.7 11/27/2023    INTACTPTH 69.0 06/06/2023         Lab work from other lab:  Baseline creatinine 1.46/GFR 52 on 01/20/2022    MEDICATIONS:  Outpatient Medications Marked as Taking for the 03/13/24 encounter (Office Visit) with Cathryne Iha, NP   Medication Sig    albuterol sulfate (PROVENTIL OR VENTOLIN OR PROAIR) 90 mcg/actuation Inhalation oral inhaler Take 1-2 Puffs by inhalation Every 6 hours as needed    aspirin (ECOTRIN) 81 mg Oral Tablet, Delayed Release (E.C.) Take 1 Tablet (81 mg total) by mouth Daily    atorvastatin (LIPITOR) 10 mg  Oral Tablet TAKE 1 TABLET BY MOUTH EVERY DAY FOR 90 DAYS    cyanocobalamin (VITAMIN B 12) 1,000 mcg Oral Tablet Take 1 Tablet (1,000 mcg total) by mouth Daily    cyclobenzaprine (FLEXERIL) 10 mg Oral Tablet Take 1 Tablet (10 mg total) by mouth Three times a day    ergocalciferol , vitamin D2, (DRISDOL) 1,250 mcg (50,000 unit) Oral Capsule Take 1 Capsule (50,000 Units total) by mouth Every 7 days    levothyroxine (SYNTHROID) 50 mcg Oral Tablet TAKE 1 TABLET BY MOUTH EVERY DAY FOR 90 DAYS    losartan (COZAAR) 50 mg Oral Tablet Take 1 Tablet (50 mg total) by mouth Daily    [DISCONTINUED] dapagliflozin  propanediol (FARXIGA ) 10 mg Oral Tablet Take 1 Tablet (10 mg total) by mouth Daily for 90 days     Current Outpatient Medications   Medication Instructions    albuterol sulfate (PROVENTIL OR VENTOLIN OR PROAIR) 90 mcg/actuation Inhalation oral inhaler 1-2 Puffs, EVERY 6 HOURS PRN    aspirin (ECOTRIN) 81 mg, Daily    atorvastatin (LIPITOR) 10 mg Oral Tablet TAKE 1 TABLET BY MOUTH EVERY DAY FOR 90 DAYS    cyanocobalamin (VITAMIN B 12) 1,000 mcg, Daily    cyclobenzaprine (FLEXERIL) 10 mg, 3 TIMES DAILY    dapagliflozin  propanediol (FARXIGA ) 10 mg, Oral, Daily     ergocalciferol  (vitamin D2) (DRISDOL) 50,000 Units, EVERY 7 DAYS    levothyroxine (SYNTHROID) 50 mcg Oral Tablet TAKE 1 TABLET BY MOUTH EVERY DAY FOR 90 DAYS    losartan (COZAAR) 50 mg, Daily       ASSESSMENT / PLAN:   ENCOUNTER DIAGNOSES     ICD-10-CM   1. Stage 3b chronic kidney disease (CMS HCC)  N18.32   2. Essential (primary) hypertension  I10   3. DM (diabetes mellitus)  E11.9   4. Vitamin D  deficiency  E55.9            Chronic kidney disease  -Stage IIIa  -Due to diabetes and hypertension, NSAIDs.  -Creatinine is 1.84  GFR 39   -Baseline creatinine 1.46  -Total protein to creatinine ratio 0.044  -CBC and a basic metabolic panel  -Return to clinic in 6 months  -Continue low-sodium diet  -balanced Fluid intake 40-50 oz a day.    -Avoid NSAIDs.  Tylenol only for pain  -d/c Celebrex   -ACEI or ARBS:  Patient is not taking losartan.  -Sodium Glucose Cotransporter-2 (SGLT2) Inhibitors:   -Farxiga  10 mg daily.        CKD bone mineral disease  -PTH   PTH   Date Value Ref Range Status   03/06/2024 40.4 12.0 - 88.0 pg/mL Final      -Vitamin D  level acceptable  -monitor vitamin-D level        Hypertension  -Blood pressure is at goal  -Goal less than 130/80  -losartan 50 mg daily  -Farxiga  10 mg daily  -Low-salt diet        Diabetes type 2  -A1c goal is less than 7   Lab Results   Component Value Date    HA1C 6.0 07/12/2022      -Farxiga  10 mg daily  -Diabetic diet  -Increase activity and exercise as possible  -Monitor A1C                      Orders Placed This Encounter    BASIC METABOLIC PANEL    CBC/DIFF    MAGNESIUM    VITAMIN D   25 TOTAL    URIC ACID    PROTEIN/CREATININE RATIO, URINE, RANDOM    PARATHYROID  HORMONE (PTH)    MICROALBUMIN/CREATININE RATIO, URINE, RANDOM

## 2024-03-13 NOTE — Nursing Note (Signed)
 Follow up labs

## 2024-03-14 ENCOUNTER — Other Ambulatory Visit (INDEPENDENT_AMBULATORY_CARE_PROVIDER_SITE_OTHER): Payer: Self-pay

## 2024-03-14 MED ORDER — DAPAGLIFLOZIN PROPANEDIOL 10 MG TABLET
10.0000 mg | ORAL_TABLET | Freq: Every day | ORAL | 3 refills | Status: DC
Start: 2024-03-14 — End: 2024-04-30

## 2024-04-24 ENCOUNTER — Encounter (INDEPENDENT_AMBULATORY_CARE_PROVIDER_SITE_OTHER): Payer: Self-pay

## 2024-04-24 NOTE — Nursing Note (Signed)
 ReFaxed the [employer] to [employer] on 04/24/24 at 11:07 am. Patient called and asked why he wasn't receiving the Farxiga  at this time. Looks ike it was faxed. Waiting on the approval through the company.

## 2024-04-30 ENCOUNTER — Other Ambulatory Visit (INDEPENDENT_AMBULATORY_CARE_PROVIDER_SITE_OTHER): Payer: Self-pay

## 2024-04-30 MED ORDER — DAPAGLIFLOZIN PROPANEDIOL 10 MG TABLET
10.0000 mg | ORAL_TABLET | Freq: Every day | ORAL | 3 refills | Status: AC
Start: 2024-04-30 — End: 2025-04-25

## 2024-04-30 NOTE — Telephone Encounter (Signed)
 Sent Farxiga  10 mg to [facility] [employer] on 04/30/24 at 1:28 pm per Suzen Risser FNP-C

## 2024-06-17 ENCOUNTER — Other Ambulatory Visit: Payer: Medicare (Managed Care)

## 2024-06-17 ENCOUNTER — Ambulatory Visit (INDEPENDENT_AMBULATORY_CARE_PROVIDER_SITE_OTHER): Payer: Medicare (Managed Care)

## 2024-06-17 ENCOUNTER — Other Ambulatory Visit: Payer: Self-pay

## 2024-06-17 DIAGNOSIS — E559 Vitamin D deficiency, unspecified: Secondary | ICD-10-CM

## 2024-06-17 DIAGNOSIS — N1832 Chronic kidney disease, stage 3b: Secondary | ICD-10-CM

## 2024-06-17 DIAGNOSIS — I10 Essential (primary) hypertension: Secondary | ICD-10-CM

## 2024-06-17 LAB — BASIC METABOLIC PANEL
ANION GAP: 7 mmol/L (ref 4–13)
BUN/CREA RATIO: 11 (ref 6–22)
BUN: 19 mg/dL (ref 7–25)
CALCIUM: 9.3 mg/dL (ref 8.6–10.3)
CHLORIDE: 106 mmol/L (ref 98–107)
CO2 TOTAL: 24 mmol/L (ref 21–31)
CREATININE: 1.76 mg/dL — ABNORMAL HIGH (ref 0.60–1.30)
ESTIMATED GFR: 41 mL/min/1.73mˆ2 — ABNORMAL LOW (ref >59)
GLUCOSE: 106 mg/dL (ref 74–109)
OSMOLALITY, CALCULATED: 277 mosm/kg (ref 270–290)
POTASSIUM: 3.8 mmol/L (ref 3.5–5.1)
SODIUM: 137 mmol/L (ref 136–145)

## 2024-06-17 LAB — CBC WITH DIFF
BASOPHIL #: 0 x10ˆ3/uL (ref 0.00–0.10)
BASOPHIL %: 1 % (ref 0–1)
EOSINOPHIL #: 0.7 x10ˆ3/uL — ABNORMAL HIGH (ref 0.00–0.60)
EOSINOPHIL %: 9 % — ABNORMAL HIGH (ref 1–8)
HCT: 45.3 % (ref 36.7–47.1)
HGB: 15.4 g/dL (ref 12.5–16.3)
LYMPHOCYTE #: 1.5 x10ˆ3/uL (ref 1.00–3.00)
LYMPHOCYTE %: 21 % (ref 15–43)
MCH: 31.1 pg (ref 23.8–33.4)
MCHC: 34 g/dL (ref 32.5–36.3)
MCV: 91.3 fL (ref 73.0–96.2)
MONOCYTE #: 0.7 x10ˆ3/uL (ref 0.30–1.10)
MONOCYTE %: 10 % (ref 6–14)
MPV: 10.6 fL (ref 7.4–11.4)
NEUTROPHIL #: 4.2 x10ˆ3/uL (ref 1.85–7.84)
NEUTROPHIL %: 59 % (ref 44–74)
PLATELETS: 184 x10ˆ3/uL (ref 140–440)
RBC: 4.96 x10ˆ6/uL (ref 4.06–5.63)
RDW: 13.9 % (ref 12.1–16.2)
WBC: 7.1 x10ˆ3/uL (ref 3.6–10.2)

## 2024-06-17 LAB — PARATHYROID HORMONE (PTH): PTH: 49 pg/mL (ref 12.0–88.0)

## 2024-06-17 LAB — VITAMIN D 25 TOTAL: VITAMIN D 25, TOTAL: 56.17 ng/mL (ref 30.00–100.00)

## 2024-06-17 LAB — URIC ACID: URIC ACID: 7.3 mg/dL (ref 2.3–7.6)

## 2024-06-17 LAB — MAGNESIUM: MAGNESIUM: 1.9 mg/dL (ref 1.9–2.7)

## 2024-06-18 LAB — MICROALBUMIN/CREATININE RATIO, URINE, RANDOM
CREATININE RANDOM URINE: 175 mg/dL — ABNORMAL HIGH (ref 30–125)
MICROALBUMIN RANDOM URINE: 0.7 mg/dL
MICROALBUMIN/CREATININE RATIO RANDOM URINE: 4 mg/g

## 2024-06-18 LAB — PROTEIN/CREATININE RATIO, URINE, RANDOM
CREATININE RANDOM URINE: 175 mg/dL — ABNORMAL HIGH (ref 30–125)
PROTEIN RANDOM URINE: 16 mg/dL — ABNORMAL LOW (ref 50–80)
PROTEIN/CREATININE RATIO RANDOM URINE: 0.091 mg/mg (ref 0.000–200.000)

## 2024-06-24 ENCOUNTER — Encounter (INDEPENDENT_AMBULATORY_CARE_PROVIDER_SITE_OTHER): Payer: Self-pay

## 2024-06-24 ENCOUNTER — Ambulatory Visit (INDEPENDENT_AMBULATORY_CARE_PROVIDER_SITE_OTHER): Payer: Medicare (Managed Care)

## 2024-06-24 ENCOUNTER — Other Ambulatory Visit: Payer: Self-pay

## 2024-06-24 VITALS — BP 150/70 | HR 55 | Ht 66.0 in | Wt 237.0 lb

## 2024-06-24 DIAGNOSIS — I129 Hypertensive chronic kidney disease with stage 1 through stage 4 chronic kidney disease, or unspecified chronic kidney disease: Secondary | ICD-10-CM

## 2024-06-24 DIAGNOSIS — Z791 Long term (current) use of non-steroidal anti-inflammatories (NSAID): Secondary | ICD-10-CM

## 2024-06-24 DIAGNOSIS — Z7984 Long term (current) use of oral hypoglycemic drugs: Secondary | ICD-10-CM

## 2024-06-24 DIAGNOSIS — E119 Type 2 diabetes mellitus without complications: Secondary | ICD-10-CM

## 2024-06-24 DIAGNOSIS — E559 Vitamin D deficiency, unspecified: Secondary | ICD-10-CM

## 2024-06-24 DIAGNOSIS — E1122 Type 2 diabetes mellitus with diabetic chronic kidney disease: Secondary | ICD-10-CM

## 2024-06-24 DIAGNOSIS — I10 Essential (primary) hypertension: Secondary | ICD-10-CM

## 2024-06-24 DIAGNOSIS — N1832 Chronic kidney disease, stage 3b: Secondary | ICD-10-CM

## 2024-06-24 NOTE — Progress Notes (Signed)
 [place] [facility]  NEPHROLOGY, [facility]    Progress Note    Name: Steven [name] MRN:  [id number]   Date: 06/24/2024 Age: 68 y.o.          Nephrology Out  Patient Visit         HPI : 68 y.o. gentleman  here for a follow up  chronic kidney disease: Creatinine 1.76 GFR 41 CKD stage  IIIb. Past medical history chronic kidney disease, diabetic non-insulin-dependent, hypertension, arthritis was on NSAIDs.. No longer taking Celebrex. Denis complaints of hematuria and dysuria.  Reports no problems with edema.  Denies nausea and vomiting.  Blood pressure is elevated this visit.  States blood pressure has been averaging 130s systolic.  Education provided regarding goal blood pressure.  States blood sugar has been in good control. Will obtain a current hemoglobin A1c results.         ROS:  Assessment  was negative except what mentioned in in the HPI.       OBJECTIVE:   BP (!) 150/70   Pulse 55   Ht 1.676 m (5' 6)   Wt 108 kg (237 lb)   BMI 38.25 kg/m       General:  NAD, AAOx3  HEENT:  EOMI,  no icterus  NECK: No increased JVD.  Normal inspection   HEART:   Regular rhythm. No murmurs or rubs. No chest wall tenderness   LUNGS: Clear to auscultation bilateral. No wheezes, rales, or rhonchi.   ABDOMEN: +BS, Soft, nontender and nondistended. No rebound or guarding present.   EXTREMITIES: No edema. No cyanosis or clubbing.No asterixis    NEURO : Normal speech, EOMI.       LABORATORY DATA:   Lab Results   Component Value Date    BUN 19 06/17/2024    BUN 19 03/06/2024    BUN 22 11/27/2023    CREATININE 1.76 (H) 06/17/2024    CREATININE 1.84 (H) 03/06/2024    CREATININE 1.62 (H) 11/27/2023    BUNCRRATIO 11 06/17/2024    BUNCRRATIO 10 03/06/2024    BUNCRRATIO 14 11/27/2023    GFR 41 (L) 06/17/2024    GFR 39 (L) 03/06/2024    GFR 46 (L) 11/27/2023    SODIUM 137 06/17/2024    SODIUM 138 03/06/2024    SODIUM 138 11/27/2023    POTASSIUM 3.8 06/17/2024    POTASSIUM 4.4 03/06/2024    POTASSIUM 4.7 11/27/2023     CHLORIDE 106 06/17/2024    CHLORIDE 103 03/06/2024    CHLORIDE 104 11/27/2023    CO2 24 06/17/2024    CO2 27 03/06/2024    CO2 28 11/27/2023    ANIONGAP 7 06/17/2024    ANIONGAP 8 03/06/2024    ANIONGAP 6 11/27/2023    CALCIUM 9.3 06/17/2024    CALCIUM 9.8 03/06/2024    CALCIUM 9.6 11/27/2023    HGB 15.4 06/17/2024    HGB 14.7 03/06/2024    HGB 15.0 11/27/2023    HCT 45.3 06/17/2024    HCT 42.8 03/06/2024    HCT 44.0 11/27/2023    INTACTPTH 49.0 06/17/2024    INTACTPTH 40.4 03/06/2024    INTACTPTH 52.7 11/27/2023         Lab work from other lab:  Baseline creatinine 1.46/GFR 52 on 01/20/2022    MEDICATIONS:  No outpatient medications have been marked as taking for the 06/24/24 encounter (Office Visit) with Cathryne Iha, APRN, CNP.     Current Outpatient Medications   Medication Instructions  albuterol sulfate (PROVENTIL OR VENTOLIN OR PROAIR) 90 mcg/actuation Inhalation oral inhaler 1-2 Puffs, EVERY 6 HOURS PRN    aspirin (ECOTRIN) 81 mg, Daily    atorvastatin (LIPITOR) 10 mg Oral Tablet TAKE 1 TABLET BY MOUTH EVERY DAY FOR 90 DAYS    cyanocobalamin (VITAMIN B 12) 1,000 mcg, Daily    cyclobenzaprine (FLEXERIL) 10 mg, 3 TIMES DAILY    dapagliflozin  propanediol (FARXIGA ) 10 mg, Oral, Daily    ergocalciferol  (vitamin D2) (DRISDOL) 50,000 Units, EVERY 7 DAYS    levothyroxine (SYNTHROID) 50 mcg Oral Tablet TAKE 1 TABLET BY MOUTH EVERY DAY FOR 90 DAYS    losartan (COZAAR) 50 mg, Daily       ASSESSMENT / PLAN:   ENCOUNTER DIAGNOSES     ICD-10-CM   1. Stage 3b chronic kidney disease (CMS HCC)  N18.32   2. Hypertension, unspecified type  I10   3. DM (diabetes mellitus)  E11.9   4. Vitamin D  deficiency  E55.9            Chronic kidney disease  -Stage IIIb  -Due to diabetes and hypertension, NSAIDs.  -Creatinine is 1.76 GFR 41   -Baseline creatinine 1.46  -Total protein to creatinine ratio 0.044  -CBC and a basic metabolic panel  -Return to clinic in 6 months  -Continue low-sodium diet  -balanced Fluid intake 40-50 oz a  day.    -Avoid NSAIDs.  Tylenol only for pain  -ACEI or ARBS:  Patient is not taking losartan.  -Sodium Glucose Cotransporter-2 (SGLT2) Inhibitors:   -Farxiga  10 mg daily.        CKD bone mineral disease  -PTH   PTH   Date Value Ref Range Status   06/17/2024 49.0 12.0 - 88.0 pg/mL Final      -Vitamin D  level acceptable  -monitor vitamin-D level  -PTH         Hypertension  -Blood pressure is at goal  -Goal less than 130/80  -losartan 50 mg daily  -Farxiga  10 mg daily  -Low-salt diet        Diabetes type 2  -A1c goal is less than 7    -Farxiga  10 mg daily  -Diabetic diet  -Increase activity and exercise as possible  -Monitor A1C                      Orders Placed This Encounter    BASIC METABOLIC PANEL    CBC/DIFF    MAGNESIUM    MICROALBUMIN/CREATININE RATIO, URINE, RANDOM    PARATHYROID  HORMONE (PTH)    PROTEIN/CREATININE RATIO, URINE, RANDOM    URIC ACID    VITAMIN D  25 TOTAL

## 2024-12-19 ENCOUNTER — Other Ambulatory Visit (INDEPENDENT_AMBULATORY_CARE_PROVIDER_SITE_OTHER): Payer: Self-pay

## 2024-12-26 ENCOUNTER — Encounter (INDEPENDENT_AMBULATORY_CARE_PROVIDER_SITE_OTHER): Payer: Self-pay
